# Patient Record
Sex: Female | Born: 1975
Health system: Southern US, Community
[De-identification: ages and names within clinical notes are randomized; demographics above are authoritative.]

## PROBLEM LIST (undated history)

## (undated) DIAGNOSIS — F329 Major depressive disorder, single episode, unspecified: Secondary | ICD-10-CM

## (undated) DIAGNOSIS — F419 Anxiety disorder, unspecified: Secondary | ICD-10-CM

## (undated) DIAGNOSIS — F32A Depression, unspecified: Secondary | ICD-10-CM

## (undated) HISTORY — PX: DILATION AND CURETTAGE OF UTERUS: SHX78

---

## 2001-07-29 ENCOUNTER — Other Ambulatory Visit: Admission: RE | Admit: 2001-07-29 | Discharge: 2001-07-29 | Payer: Self-pay | Admitting: Obstetrics and Gynecology

## 2001-09-18 ENCOUNTER — Inpatient Hospital Stay (HOSPITAL_COMMUNITY): Admission: AD | Admit: 2001-09-18 | Discharge: 2001-09-18 | Payer: Self-pay | Admitting: *Deleted

## 2002-02-20 ENCOUNTER — Inpatient Hospital Stay (HOSPITAL_COMMUNITY): Admission: AD | Admit: 2002-02-20 | Discharge: 2002-02-22 | Payer: Self-pay | Admitting: Obstetrics and Gynecology

## 2002-04-01 ENCOUNTER — Other Ambulatory Visit: Admission: RE | Admit: 2002-04-01 | Discharge: 2002-04-01 | Payer: Self-pay | Admitting: Obstetrics and Gynecology

## 2003-08-07 ENCOUNTER — Other Ambulatory Visit: Admission: RE | Admit: 2003-08-07 | Discharge: 2003-08-07 | Payer: Self-pay | Admitting: Obstetrics and Gynecology

## 2004-09-01 ENCOUNTER — Other Ambulatory Visit: Admission: RE | Admit: 2004-09-01 | Discharge: 2004-09-01 | Payer: Self-pay | Admitting: Obstetrics and Gynecology

## 2008-02-27 ENCOUNTER — Encounter: Admission: RE | Admit: 2008-02-27 | Discharge: 2008-02-27 | Payer: Self-pay | Admitting: Obstetrics and Gynecology

## 2010-12-11 ENCOUNTER — Encounter: Payer: Self-pay | Admitting: Obstetrics and Gynecology

## 2011-04-20 ENCOUNTER — Other Ambulatory Visit: Payer: Self-pay | Admitting: Obstetrics and Gynecology

## 2011-04-20 DIAGNOSIS — N644 Mastodynia: Secondary | ICD-10-CM

## 2011-04-25 ENCOUNTER — Ambulatory Visit
Admission: RE | Admit: 2011-04-25 | Discharge: 2011-04-25 | Disposition: A | Discharge status: Home / Self Care | Payer: PRIVATE HEALTH INSURANCE | Source: Ambulatory Visit | Attending: Obstetrics and Gynecology | Admitting: Obstetrics and Gynecology

## 2011-04-25 ENCOUNTER — Other Ambulatory Visit: Payer: Self-pay | Admitting: Obstetrics and Gynecology

## 2011-04-25 DIAGNOSIS — N644 Mastodynia: Secondary | ICD-10-CM

## 2017-08-09 DIAGNOSIS — F32A Depression, unspecified: Secondary | ICD-10-CM | POA: Insufficient documentation

## 2017-08-09 DIAGNOSIS — F329 Major depressive disorder, single episode, unspecified: Secondary | ICD-10-CM | POA: Insufficient documentation

## 2017-08-16 DIAGNOSIS — F909 Attention-deficit hyperactivity disorder, unspecified type: Secondary | ICD-10-CM | POA: Insufficient documentation

## 2017-08-16 DIAGNOSIS — G4700 Insomnia, unspecified: Secondary | ICD-10-CM | POA: Insufficient documentation

## 2017-08-16 DIAGNOSIS — Z8619 Personal history of other infectious and parasitic diseases: Secondary | ICD-10-CM | POA: Insufficient documentation

## 2017-08-16 DIAGNOSIS — F419 Anxiety disorder, unspecified: Secondary | ICD-10-CM | POA: Insufficient documentation

## 2017-11-20 HISTORY — PX: BREAST BIOPSY: SHX20

## 2018-05-28 MED FILL — TRANEXAMIC ACID 650 MG TAB: 650 | 5 days supply | Qty: 30 | Fill #0

## 2018-05-29 MED FILL — DOXEPIN 25 MG CAPSULE: 25 | 90 days supply | Qty: 90 | Fill #0

## 2018-05-29 MED FILL — VIT D2 1.25 MG (50,000 UNIT: 1.25 MG | 56 days supply | Qty: 8 | Fill #0

## 2018-05-29 MED FILL — DESVENLAFAXINE SUC ER 50 MG: 50 | 53 days supply | Qty: 53 | Fill #0

## 2018-05-31 DIAGNOSIS — F909 Attention-deficit hyperactivity disorder, unspecified type: Secondary | ICD-10-CM | POA: Diagnosis not present

## 2018-05-31 DIAGNOSIS — F419 Anxiety disorder, unspecified: Secondary | ICD-10-CM | POA: Diagnosis not present

## 2018-05-31 DIAGNOSIS — G4700 Insomnia, unspecified: Secondary | ICD-10-CM | POA: Diagnosis not present

## 2018-05-31 MED FILL — ALPRAZolam 1 MG TABS: 1 | 30 days supply | Qty: 60 | Fill #0

## 2018-05-31 MED FILL — METHYLPHENIDATE 10 MG TAB: 10 | 30 days supply | Qty: 60 | Fill #0

## 2018-07-01 DIAGNOSIS — F411 Generalized anxiety disorder: Secondary | ICD-10-CM | POA: Diagnosis not present

## 2018-07-01 DIAGNOSIS — R102 Pelvic and perineal pain: Secondary | ICD-10-CM | POA: Diagnosis not present

## 2018-07-01 DIAGNOSIS — Z809 Family history of malignant neoplasm, unspecified: Secondary | ICD-10-CM | POA: Diagnosis not present

## 2018-07-03 ENCOUNTER — Other Ambulatory Visit (HOSPITAL_COMMUNITY): Payer: Self-pay | Admitting: Radiology

## 2018-07-03 DIAGNOSIS — Z8481 Family history of carrier of genetic disease: Secondary | ICD-10-CM

## 2018-07-04 ENCOUNTER — Encounter: Payer: Self-pay | Admitting: *Deleted

## 2018-07-04 DIAGNOSIS — F411 Generalized anxiety disorder: Secondary | ICD-10-CM | POA: Diagnosis not present

## 2018-07-05 ENCOUNTER — Other Ambulatory Visit: Payer: Self-pay

## 2018-07-05 ENCOUNTER — Encounter: Payer: Self-pay | Admitting: Physician Assistant

## 2018-07-05 ENCOUNTER — Ambulatory Visit: Payer: 59 | Admitting: Physician Assistant

## 2018-07-05 VITALS — BP 110/78 | HR 84 | Temp 98.1°F | Resp 18 | Ht 66.14 in | Wt 179.4 lb

## 2018-07-05 DIAGNOSIS — F411 Generalized anxiety disorder: Secondary | ICD-10-CM

## 2018-07-05 DIAGNOSIS — G479 Sleep disorder, unspecified: Secondary | ICD-10-CM

## 2018-07-05 MED ORDER — CLONAZEPAM 0.5 MG PO TABS: 0.2500 mg | ORAL_TABLET | Freq: Three times a day (TID) | ORAL | 0 refills | Status: DC

## 2018-07-05 MED ORDER — TRAZODONE HCL 50 MG PO TABS: 25.0000 mg | ORAL_TABLET | Freq: Every evening | ORAL | 0 refills | Status: DC | PRN

## 2018-07-05 NOTE — Patient Instructions (Addendum)
Goal is to increase [employer] with klonopin -- you can take 1/2 -1 pill tid scheduled  Trazodone - start after you know how Klonopin works in your system  To start the Trazodone - start with 1/2 pill at night for the 1st 4 nights - increase the dose by 25mg  (1/2 pill) every 4-5 nights until either you sleep well, have side effects or reach a nightly dose of 150mg   [facility], [place], [place] [address] Phone: [phone]    If you have lab work done today you will be contacted with your lab results within the next 2 weeks.  If you have not heard from [place] then please contact us. The fastest way to get your results is to register for My Chart.   IF you received an x-ray today, you will receive an invoice from [facility] Rad[facility]. Please contact [facility] Radiology at [phone] with questions or concerns regarding your invoice.   IF you received labwork today, you will receive an invoice from [facility]. Please contact [facility] at [phone] with questions or concerns regarding your invoice.   Our billing staff will not be able to assist you with questions regarding bills from these companies.  You will be contacted with the lab results as soon as they are available. The fastest way to get your results is to activate your My Chart account. Instructions are located on the last page of this paperwork. If you have not heard from [place] regarding the results in 2 weeks, please contact this office.

## 2018-07-05 NOTE — Progress Notes (Signed)
Mallory Hernandez  MRN: [id number] DOB: 05-07-1976  PCP: Mancel Bale, PA-C  Chief Complaint  Patient presents with  . Establish Care    Subjective:  Pt presents to clinic for GAD and depression which she has had for a long time (started with post-partum (about 16 years ago), got worse in 2014 with a divorce) but for the last 2 months things have gotten worse.  The increase in her symptoms seem to mimic her time in her new job at [employer].  Nurse in cardio ICU.  Currently on Pristiq 50mg  (she has been on that since April - she felt like it was helping -- she has been on Lexapro (helped but then stopped), Wellbutrin (did not like), Zoloft (for post-partum depression).  Currently taking - Xanax - takes it a lot and that upsets her -but she gets very short relief and that her anxiety spikes  Ritalin - she has been on it for a year and it has helped with focus at work but recently with increased anxiety she is concerned that this is making it worse  Has 2 children- 24 and 32 - they do not live with her  Insomnia - been a problem for a while - gotten worse over the last several weeks - she used benadryl, doxipen (did not work), remeron (no help on 30mg ) - does not want to get out of bed - no motivation and energy - Feels defeated -- no SI or HI.  Cries all the time.  Seeing Dr Pablo Ledger with center for [facility].    Spoke with Dr. Pablo Ledger patient has significant depression and anxiety.  He had she has been on multiple medications in the past.  Currently on Pristiq though having significant breakthrough anxiety along with anxiety attacks and panic attacks.  Consider trial of rescue medications scheduled doses to help control anxiety so therapy can be useful as well as allow [place] in the next several weeks to increase Pristiq dose to a more appropriate dose for her level of anxiety.  Also needs help with sleep consider trazodone.  Depression and anxiety.   History is obtained by  patient.  Review of Systems  Psychiatric/Behavioral: Positive for dysphoric mood and sleep disturbance. Negative for self-injury and suicidal ideas. The patient is nervous/anxious.     There are no active problems to display for this patient.   Current Outpatient Medications on File Prior to Visit  Medication Sig Dispense Refill  . ALPRAZolam (XANAX) 1 MG tablet Take by mouth.    . desvenlafaxine (PRISTIQ) 50 MG 24 hr tablet Take by mouth.    . FeAsp-FeFum -Suc-C-Thre-B12-FA (MULTIGEN PLUS) 50-101-1 MG TABS Take by mouth.    . methylphenidate (RITALIN) 10 MG tablet Take by mouth.    . tranexamic acid (LYSTEDA) 650 MG TABS tablet   4  . Vitamin D, Ergocalciferol, (DRISDOL) 50000 units CAPS capsule Take by mouth.     No current facility-administered medications on file prior to visit.     Allergies  Allergen Reactions  . Amoxicillin-Pot Clavulanate Diarrhea    No past medical history on file. Social History   Social History Narrative  . Not on file   Social History   Tobacco Use  . Smoking status: Never Smoker  . Smokeless tobacco: Never Used  Substance Use Topics  . Alcohol use: Not on file  . Drug use: Not on file   family history is not on file.     Objective:  BP 110/78  Pulse 84   Temp 98.1 F (36.7 C) (Oral)   Resp 18   Ht 5' 6.14" (1.68 m)   Wt 179 lb 6.4 oz (81.4 kg)   LMP 07/01/2018   SpO2 98%   BMI 28.83 kg/m  Body mass index is 28.83 kg/m.  Wt Readings from Last 3 Encounters:  07/05/18 179 lb 6.4 oz (81.4 kg)    Physical Exam  Constitutional: She is oriented to person, place, and time. She appears well-developed and well-nourished.  HENT:  Head: Normocephalic and atraumatic.  Right Ear: Hearing and external ear normal.  Left Ear: Hearing and external ear normal.  Eyes: Conjunctivae are normal.  Neck: Normal range of motion.  Pulmonary/Chest: Effort normal.  Neurological: She is alert and oriented to person, place, and time.  Skin:  Skin is warm, dry and intact.  Psychiatric: She has a normal mood and affect. Her behavior is normal. Judgment and thought content normal.  Vitals reviewed.   Assessment and Plan :  GAD (generalized anxiety disorder) - Plan: clonazePAM (KLONOPIN) 0.5 MG tablet, traZODone (DESYREL) 50 MG tablet  Sleep disturbance - Plan: traZODone (DESYREL) 50 MG tablet   Patient has uncontrolled anxiety.  She is currently working with therapist but anxiety is so bad she is unable to do effective therapy.  We will use Klonopin and scheduled doses starting a quarter of a milligram working up to half a milligram if needed 3 times daily.  Once she has her anxiety controlled we will work on sleep with trazodone starting at 25 mg working up to 150 mg or more if needed to initiate and sustain sleep throughout the night.  Once we have her daily symptoms of anxiety controlled the plan is to increase Pristiq to area of control of anxiety where we can back down and use Klonopin as needed.  Patient will follow-up with me in the next 2 to 3 weeks so we can make sure she has medications to allow her to see me at the new office.  She will start Ritalin at this point as it may be making her anxiety worse discussed that this may be restarted in the future.  Patient verbalized to me that they understand the following: diagnosis, what is being done for them, what to expect and what should be done at home.  Their questions have been answered.  See after visit summary for patient specific instructions.  Windell Hummingbird PA-C  [facility] 07/05/2018 6:12 PM  Please note: Portions of this report may have been transcribed using dragon voice recognition software. Every effort was made to ensure accuracy; however, inadvertent computerized transcription errors may be present.

## 2018-07-08 ENCOUNTER — Encounter: Payer: Self-pay | Admitting: Physician Assistant

## 2018-07-08 ENCOUNTER — Ambulatory Visit: Payer: Self-pay | Admitting: Physician Assistant

## 2018-07-08 MED ORDER — LORAZEPAM 0.5 MG PO TABS: 0.2500 mg | ORAL_TABLET | Freq: Three times a day (TID) | ORAL | 0 refills | Status: DC

## 2018-07-09 DIAGNOSIS — F411 Generalized anxiety disorder: Secondary | ICD-10-CM | POA: Diagnosis not present

## 2018-07-15 DIAGNOSIS — F411 Generalized anxiety disorder: Secondary | ICD-10-CM | POA: Diagnosis not present

## 2018-07-16 ENCOUNTER — Encounter: Payer: Self-pay | Admitting: Physician Assistant

## 2018-07-16 ENCOUNTER — Telehealth: Payer: Self-pay | Admitting: Physician Assistant

## 2018-07-16 ENCOUNTER — Other Ambulatory Visit (HOSPITAL_COMMUNITY): Payer: Self-pay | Admitting: Radiology

## 2018-07-16 DIAGNOSIS — F411 Generalized anxiety disorder: Secondary | ICD-10-CM

## 2018-07-16 DIAGNOSIS — Z9189 Other specified personal risk factors, not elsewhere classified: Secondary | ICD-10-CM

## 2018-07-16 MED ORDER — ALPRAZOLAM ER 1 MG PO TB24: 1.0000 mg | ORAL_TABLET | Freq: Three times a day (TID) | ORAL | 0 refills | Status: DC

## 2018-07-16 MED FILL — ALPRAZolam XR 1 MG TB24: 1 | 10 days supply | Qty: 30 | Fill #0

## 2018-07-16 NOTE — Telephone Encounter (Signed)
Pt has brought the Ativan Klonopin and Xanax to the therapist and they will dispose of them.

## 2018-07-16 NOTE — Telephone Encounter (Signed)
  Talked with Dr Pablo Ledger.  Pt is not doing well with her medications.  She is changing her medication dosing based on her symptoms and not by guidance from either of [place].  She has Klonopin, Xanax, and Ativan at home.  She feels like klonopin gives her a headache.   She does not feel like the trazodone helps her sleep -- at night she is taking klonopin and xanax at night.  Because xanax is all that she feels that works and she does not want that changed.  She thinks the xanax works the best but she complains that she has to take to often in her visit with me. Switch to Xanax XR -- 1mg  tid -- for all day control.  At night she will have one Xanax XR and she can use the trazadone if she wants to.  She will return the other benzos she has at home before I write the Xanax XR.

## 2018-07-16 NOTE — Telephone Encounter (Signed)
    Duplicate chart - opened in error

## 2018-07-18 ENCOUNTER — Encounter: Payer: Self-pay | Admitting: Physician Assistant

## 2018-07-18 ENCOUNTER — Ambulatory Visit: Payer: 59 | Admitting: Physician Assistant

## 2018-07-18 ENCOUNTER — Other Ambulatory Visit: Payer: Self-pay

## 2018-07-18 VITALS — BP 108/72 | HR 89 | Temp 98.5°F | Resp 18 | Ht 66.14 in | Wt 179.6 lb

## 2018-07-18 DIAGNOSIS — F321 Major depressive disorder, single episode, moderate: Secondary | ICD-10-CM

## 2018-07-18 DIAGNOSIS — G479 Sleep disorder, unspecified: Secondary | ICD-10-CM | POA: Insufficient documentation

## 2018-07-18 DIAGNOSIS — F411 Generalized anxiety disorder: Secondary | ICD-10-CM | POA: Diagnosis not present

## 2018-07-18 NOTE — Progress Notes (Signed)
Mallory Hernandez  MRN: [id number] DOB: [birth year]  PCP: Mancel Bale, PA-C  Chief Complaint  Patient presents with  . Anxiety    follow up     Subjective:  Pt presents to clinic for recheck of her anxiety.  She started Xanax XR 1 mg 3 times daily dosing 2 days ago.  She feels much better in regards to her anxiety since doing this.  She feels like the roller coaster of immediate release Xanax has gone away, and she feels stable throughout the day in regards to her anxiety.  Her depression is much worse but she is aware that this may just be that her anxiety is not masking it.  She continues daily discussion with therapy office.  She has weekly therapy sessions.  She is still not sleeping well.  Feels like her mind will not turn off.  Depression - low motivation, no suicidal or homicidal ideation  Sleep disturbance - been a problem for years - gabapentin 3600mg  at night in the past -- benadryl does not help long, melatonin XR only gave about 2 hours, Ambien did not work.  GAD - pristiq 50mg  --   Spoke with Dr. Pablo Ledger at [facility] for [facility] therapy regarding patient her symptoms and current therapy as well as plans for medication therapy in the future.  Speak to him weekly regarding patient and her medications.  Working with him with her dose titrations.  History is obtained by patient.  Review of Systems  Psychiatric/Behavioral: Positive for dysphoric mood (Not controlled). The patient is nervous/anxious (More controlled).     Patient Active Problem List   Diagnosis Date Noted  . Current moderate episode of major depressive disorder without prior episode ([place]) 07/18/2018  . Sleep disturbance 07/18/2018  . GAD (generalized anxiety disorder) 07/18/2018  . Adult ADHD 08/16/2017  . Anxiety 08/16/2017  . H/O cold sores 08/16/2017  . Insomnia 08/16/2017  . Depression 08/09/2017    Current Outpatient Medications on File Prior to Visit  Medication Sig Dispense Refill  .  ALPRAZolam (XANAX XR) 1 MG 24 hr tablet Take 1 tablet (1 mg total) by mouth 3 (three) times daily. 30 tablet 0  . desvenlafaxine (PRISTIQ) 50 MG 24 hr tablet Take by mouth.    . FeAsp-FeFum -Suc-C-Thre-B12-FA (MULTIGEN PLUS) 50-101-1 MG TABS Take by mouth.    . tranexamic acid (LYSTEDA) 650 MG TABS tablet   4  . Vitamin D, Ergocalciferol, (DRISDOL) 50000 units CAPS capsule Take by mouth.    . clonazePAM (KLONOPIN) 0.5 MG tablet Take 0.5-1 tablets (0.25-0.5 mg total) by mouth 3 (three) times daily. (Patient not taking: Reported on 07/18/2018) 60 tablet 0  . LORazepam (ATIVAN) 0.5 MG tablet Take 0.5-1 tablets (0.25-0.5 mg total) by mouth 3 (three) times daily. (Patient not taking: Reported on 07/18/2018) 30 tablet 0  . methylphenidate (RITALIN) 10 MG tablet Take by mouth.     No current facility-administered medications on file prior to visit.     Allergies  Allergen Reactions  . Amoxicillin-Pot Clavulanate Diarrhea    No past medical history on file. Social History   Social History Narrative  . Not on file   Social History   Tobacco Use  . Smoking status: Never Smoker  . Smokeless tobacco: Never Used  Substance Use Topics  . Alcohol use: Not on file  . Drug use: Not on file   family history is not on file.     Objective:  BP 108/72   Pulse 89  Temp 98.5 F (36.9 C) (Oral)   Resp 18   Ht 5' 6.14" (1.68 m)   Wt 179 lb 9.6 oz (81.5 kg)   LMP 07/01/2018   SpO2 98%   BMI 28.87 kg/m  Body mass index is 28.87 kg/m.  Wt Readings from Last 3 Encounters:  07/18/18 179 lb 9.6 oz (81.5 kg)  07/05/18 179 lb 6.4 oz (81.4 kg)    Physical Exam  Constitutional: She is oriented to person, place, and time. She appears well-developed and well-nourished.  HENT:  Head: Normocephalic and atraumatic.  Right Ear: Hearing and external ear normal.  Left Ear: Hearing and external ear normal.  Eyes: Conjunctivae are normal.  Neck: Normal range of motion.  Pulmonary/Chest: Effort  normal.  Neurological: She is alert and oriented to person, place, and time.  Skin: Skin is warm, dry and intact.  Psychiatric: Her behavior is normal. Judgment and thought content normal.  Less anxious in office, no rolling in fidgeting with hands.  Not tearful in office today.  Vitals reviewed.  Spent 30 mins with the patient - greater than 50% of the visit was face to face counseling patient regarding current situation as well as speaking to therapist regarding her current situation and therapy options.   Assessment and Plan :  GAD (generalized anxiety disorder)  Sleep disturbance  Current moderate episode of major depressive disorder without prior episode ([facility])  Continue Xanax XR 1 mg 3 times daily.  Want to do this for the next several days to make sure she continues to be stable, then will likely increase Pristiq to 100 mg.  For now continue this dose of Xanax and not increase it at night.  Would like to see if she gets some benefit with sleep from continued use.  Patient tried trazodone but it caused her to have panic attacks, she is therefore concerned to restart this.  We talked about Remeron but concerned about weight gain.  She is been on gabapentin in the past but very high doses without much success.  Consider Seroquel at night.  Could increase Xanax XR at night but concerned that sleep is a long-term issue for her so this might become a problem.  She is very aware of the addictive potential of Xanax and would like to not be on this for longer than she has to.  She will continue daily check in with therapy office, and weekly visits with therapist.  I will communicate plan with therapist so he is aware of what we did today.  Patient verbalized to me that they understand the following: diagnosis, what is being done for them, what to expect and what should be done at home.  Their questions have been answered.  See after visit summary for patient specific instructions.  Windell Hummingbird  PA-C  [facility] 07/18/2018 5:04 PM  Please note: Portions of this report may have been transcribed using dragon voice recognition software. Every effort was made to ensure accuracy; however, inadvertent computerized transcription errors may be present.

## 2018-07-18 NOTE — Patient Instructions (Addendum)
   [facility], [place], [place] [address] Phone: [phone]  Continue Xanax 3x./day  Valerian root -- Melatonin 10mg  max  Medications we talked about for sleep Trazodone - may not feel comfortable Gabapentin - did not help Remeron - concern for weight gain Elavil -  Seroquel -   My chart me on Tuesday or Wedneday about how you are doing so I can get your more Xanax  If you have lab work done today you will be contacted with your lab results within the next 2 weeks.  If you have not heard from [place] then please contact us. The fastest way to get your results is to register for My Chart.   IF you received an x-ray today, you will receive an invoice from [facility] Rad[facility]. Please contact [facility] Radiology at [phone] with questions or concerns regarding your invoice.   IF you received labwork today, you will receive an invoice from [facility]. Please contact [facility] at [phone] with questions or concerns regarding your invoice.   Our billing staff will not be able to assist you with questions regarding bills from these companies.  You will be contacted with the lab results as soon as they are available. The fastest way to get your results is to activate your My Chart account. Instructions are located on the last page of this paperwork. If you have not heard from [place] regarding the results in 2 weeks, please contact this office.

## 2018-07-23 ENCOUNTER — Encounter: Payer: Self-pay | Admitting: Physician Assistant

## 2018-07-23 DIAGNOSIS — N924 Excessive bleeding in the premenopausal period: Secondary | ICD-10-CM | POA: Diagnosis not present

## 2018-07-23 DIAGNOSIS — R1031 Right lower quadrant pain: Secondary | ICD-10-CM | POA: Diagnosis not present

## 2018-07-23 DIAGNOSIS — Z8041 Family history of malignant neoplasm of ovary: Secondary | ICD-10-CM | POA: Diagnosis not present

## 2018-07-23 DIAGNOSIS — F411 Generalized anxiety disorder: Secondary | ICD-10-CM

## 2018-07-24 MED ORDER — ALPRAZOLAM ER 1 MG PO TB24: 1.0000 mg | ORAL_TABLET | Freq: Three times a day (TID) | ORAL | 0 refills | Status: DC

## 2018-07-24 MED FILL — ALPRAZolam XR 1 MG TB24: 1 | 30 days supply | Qty: 90 | Fill #0

## 2018-07-25 DIAGNOSIS — F411 Generalized anxiety disorder: Secondary | ICD-10-CM | POA: Diagnosis not present

## 2018-08-06 ENCOUNTER — Other Ambulatory Visit: Payer: Self-pay | Admitting: Physician Assistant

## 2018-08-06 DIAGNOSIS — F411 Generalized anxiety disorder: Secondary | ICD-10-CM

## 2018-08-06 MED ORDER — ALPRAZOLAM ER 1 MG PO TB24: ORAL_TABLET | ORAL | 0 refills | Status: AC

## 2018-08-06 NOTE — Progress Notes (Signed)
Opened in error this must be a duplicate chart.

## 2018-08-06 NOTE — Progress Notes (Signed)
Patient is doing okay on current dose of Xanax but needs some of her improved anxiety control in the morning.  Will increase dose to Xanax 2 mg in the morning, 1 mg in the afternoon and 2 mg in the evening.

## 2018-08-10 DIAGNOSIS — F411 Generalized anxiety disorder: Secondary | ICD-10-CM | POA: Diagnosis not present

## 2018-08-12 DIAGNOSIS — F411 Generalized anxiety disorder: Secondary | ICD-10-CM | POA: Diagnosis not present

## 2018-08-12 MED FILL — ALPRAZolam XR 1 MG TB24: 1 | 30 days supply | Qty: 150 | Fill #0

## 2018-08-14 ENCOUNTER — Other Ambulatory Visit: Payer: Self-pay | Admitting: Physician Assistant

## 2018-08-14 MED ORDER — DESVENLAFAXINE SUCCINATE ER 50 MG PO TB24: 50.0000 mg | ORAL_TABLET | Freq: Every day | ORAL | 0 refills | Status: DC

## 2018-08-14 MED ORDER — DESVENLAFAXINE SUCCINATE ER 100 MG PO TB24: 100.0000 mg | ORAL_TABLET | Freq: Every day | ORAL | 0 refills | Status: AC

## 2018-08-14 MED FILL — DESVENLAFAXINE SUC ER 50 MG: 50 | 90 days supply | Qty: 90 | Fill #0

## 2018-08-14 MED FILL — DESVENLAFAXINE SUC ER 100 M: 100 | 90 days supply | Qty: 90 | Fill #0

## 2018-08-14 NOTE — Progress Notes (Signed)
Ok to increase pristiq to 150mg 

## 2018-08-20 DIAGNOSIS — K648 Other hemorrhoids: Secondary | ICD-10-CM | POA: Diagnosis not present

## 2018-08-20 DIAGNOSIS — Z1211 Encounter for screening for malignant neoplasm of colon: Secondary | ICD-10-CM | POA: Diagnosis not present

## 2018-08-20 DIAGNOSIS — Z8371 Family history of colonic polyps: Secondary | ICD-10-CM | POA: Diagnosis not present

## 2018-08-22 DIAGNOSIS — F411 Generalized anxiety disorder: Secondary | ICD-10-CM | POA: Diagnosis not present

## 2018-08-23 DIAGNOSIS — Z1231 Encounter for screening mammogram for malignant neoplasm of breast: Secondary | ICD-10-CM | POA: Diagnosis not present

## 2018-08-23 DIAGNOSIS — N6320 Unspecified lump in the left breast, unspecified quadrant: Secondary | ICD-10-CM | POA: Diagnosis not present

## 2018-08-28 ENCOUNTER — Other Ambulatory Visit: Payer: Self-pay | Admitting: Obstetrics and Gynecology

## 2018-08-28 DIAGNOSIS — R928 Other abnormal and inconclusive findings on diagnostic imaging of breast: Secondary | ICD-10-CM

## 2018-08-29 DIAGNOSIS — F411 Generalized anxiety disorder: Secondary | ICD-10-CM | POA: Diagnosis not present

## 2018-09-03 ENCOUNTER — Ambulatory Visit
Admission: RE | Admit: 2018-09-03 | Discharge: 2018-09-03 | Disposition: A | Discharge status: Home / Self Care | Payer: 59 | Source: Ambulatory Visit | Attending: Obstetrics and Gynecology | Admitting: Obstetrics and Gynecology

## 2018-09-03 ENCOUNTER — Other Ambulatory Visit: Payer: Self-pay | Admitting: Obstetrics and Gynecology

## 2018-09-03 DIAGNOSIS — R928 Other abnormal and inconclusive findings on diagnostic imaging of breast: Secondary | ICD-10-CM

## 2018-09-03 DIAGNOSIS — N6313 Unspecified lump in the right breast, lower outer quadrant: Secondary | ICD-10-CM | POA: Diagnosis not present

## 2018-09-03 DIAGNOSIS — N6310 Unspecified lump in the right breast, unspecified quadrant: Secondary | ICD-10-CM

## 2018-09-03 DIAGNOSIS — Z803 Family history of malignant neoplasm of breast: Secondary | ICD-10-CM | POA: Diagnosis not present

## 2018-09-03 DIAGNOSIS — R922 Inconclusive mammogram: Secondary | ICD-10-CM | POA: Diagnosis not present

## 2018-09-04 ENCOUNTER — Ambulatory Visit
Admission: RE | Admit: 2018-09-04 | Discharge: 2018-09-04 | Disposition: A | Discharge status: Home / Self Care | Payer: 59 | Source: Ambulatory Visit | Attending: Obstetrics and Gynecology | Admitting: Obstetrics and Gynecology

## 2018-09-04 DIAGNOSIS — F411 Generalized anxiety disorder: Secondary | ICD-10-CM | POA: Diagnosis not present

## 2018-09-04 DIAGNOSIS — D241 Benign neoplasm of right breast: Secondary | ICD-10-CM | POA: Diagnosis not present

## 2018-09-04 DIAGNOSIS — N6313 Unspecified lump in the right breast, lower outer quadrant: Secondary | ICD-10-CM | POA: Diagnosis not present

## 2018-09-04 DIAGNOSIS — N6310 Unspecified lump in the right breast, unspecified quadrant: Secondary | ICD-10-CM

## 2018-09-12 DIAGNOSIS — F411 Generalized anxiety disorder: Secondary | ICD-10-CM | POA: Diagnosis not present

## 2018-09-12 MED FILL — ALPRAZolam XR 1 MG TB24: 1 | 30 days supply | Qty: 150 | Fill #0

## 2018-09-17 DIAGNOSIS — Z79899 Other long term (current) drug therapy: Secondary | ICD-10-CM | POA: Diagnosis not present

## 2018-09-17 DIAGNOSIS — F411 Generalized anxiety disorder: Secondary | ICD-10-CM | POA: Diagnosis not present

## 2018-09-17 DIAGNOSIS — E559 Vitamin D deficiency, unspecified: Secondary | ICD-10-CM | POA: Diagnosis not present

## 2018-09-17 DIAGNOSIS — Z0000 Encounter for general adult medical examination without abnormal findings: Secondary | ICD-10-CM | POA: Diagnosis not present

## 2018-09-17 DIAGNOSIS — D509 Iron deficiency anemia, unspecified: Secondary | ICD-10-CM | POA: Diagnosis not present

## 2018-10-15 MED FILL — traZODone HCL 100 MG TABS: 100 | 30 days supply | Qty: 90 | Fill #0

## 2018-10-15 MED FILL — ALPRAZolam XR 1 MG TB24: 1 | 30 days supply | Qty: 150 | Fill #0

## 2018-11-07 MED FILL — DESVENLAFAXINE SUC ER 50 MG: 50 | 90 days supply | Qty: 90 | Fill #0

## 2018-11-07 MED FILL — DESVENLAFAXINE SUC ER 100 M: 100 | 90 days supply | Qty: 90 | Fill #0

## 2018-11-22 ENCOUNTER — Inpatient Hospital Stay (HOSPITAL_COMMUNITY)
Admission: AD | Admit: 2018-11-22 | Discharge: 2018-11-22 | Disposition: A | Discharge status: Home / Self Care | Payer: 59 | Attending: Obstetrics and Gynecology | Admitting: Obstetrics and Gynecology

## 2018-11-22 ENCOUNTER — Encounter (HOSPITAL_COMMUNITY): Payer: Self-pay | Admitting: *Deleted

## 2018-11-22 ENCOUNTER — Inpatient Hospital Stay (HOSPITAL_COMMUNITY): Payer: 59

## 2018-11-22 ENCOUNTER — Other Ambulatory Visit: Payer: Self-pay

## 2018-11-22 DIAGNOSIS — Z79899 Other long term (current) drug therapy: Secondary | ICD-10-CM | POA: Insufficient documentation

## 2018-11-22 DIAGNOSIS — F419 Anxiety disorder, unspecified: Secondary | ICD-10-CM | POA: Insufficient documentation

## 2018-11-22 DIAGNOSIS — Z885 Allergy status to narcotic agent status: Secondary | ICD-10-CM | POA: Diagnosis not present

## 2018-11-22 DIAGNOSIS — D259 Leiomyoma of uterus, unspecified: Secondary | ICD-10-CM | POA: Diagnosis not present

## 2018-11-22 DIAGNOSIS — F329 Major depressive disorder, single episode, unspecified: Secondary | ICD-10-CM | POA: Diagnosis not present

## 2018-11-22 DIAGNOSIS — Z880 Allergy status to penicillin: Secondary | ICD-10-CM | POA: Insufficient documentation

## 2018-11-22 DIAGNOSIS — R102 Pelvic and perineal pain: Secondary | ICD-10-CM

## 2018-11-22 HISTORY — DX: Depression, unspecified: F32.A

## 2018-11-22 HISTORY — DX: Major depressive disorder, single episode, unspecified: F32.9

## 2018-11-22 HISTORY — DX: Anxiety disorder, unspecified: F41.9

## 2018-11-22 LAB — CBC WITH DIFFERENTIAL/PLATELET
Basophils Absolute: 0 10*3/uL (ref 0.0–0.1)
Basophils Relative: 0 %
Eosinophils Absolute: 0.1 10*3/uL (ref 0.0–0.5)
Eosinophils Relative: 1 %
HEMATOCRIT: 41.7 % (ref 36.0–46.0)
HEMOGLOBIN: 13.6 g/dL (ref 12.0–15.0)
LYMPHS PCT: 29 %
Lymphs Abs: 1.9 10*3/uL (ref 0.7–4.0)
MCH: 30.4 pg (ref 26.0–34.0)
MCHC: 32.6 g/dL (ref 30.0–36.0)
MCV: 93.1 fL (ref 80.0–100.0)
MONOS PCT: 6 %
Monocytes Absolute: 0.4 10*3/uL (ref 0.1–1.0)
NEUTROS ABS: 4.1 10*3/uL (ref 1.7–7.7)
NEUTROS PCT: 64 %
Platelets: 233 10*3/uL (ref 150–400)
RBC: 4.48 MIL/uL (ref 3.87–5.11)
RDW: 12.7 % (ref 11.5–15.5)
WBC: 6.5 10*3/uL (ref 4.0–10.5)
nRBC: 0 % (ref 0.0–0.2)

## 2018-11-22 LAB — URINALYSIS, ROUTINE W REFLEX MICROSCOPIC
Bilirubin Urine: NEGATIVE
GLUCOSE, UA: NEGATIVE mg/dL
Ketones, ur: NEGATIVE mg/dL
Leukocytes, UA: NEGATIVE
Nitrite: NEGATIVE
Protein, ur: NEGATIVE mg/dL
Specific Gravity, Urine: 1.005 (ref 1.005–1.030)
pH: 5 (ref 5.0–8.0)

## 2018-11-22 LAB — POCT PREGNANCY, URINE: PREG TEST UR: NEGATIVE

## 2018-11-22 MED ORDER — KETOROLAC TROMETHAMINE 30 MG/ML IJ SOLN
30.0000 mg | Freq: Once | INTRAMUSCULAR | Status: AC
  Administered 2018-11-22: 30 mg via INTRAVENOUS
  Filled 2018-11-22: qty 1

## 2018-11-22 MED ORDER — KETOROLAC TROMETHAMINE 10 MG PO TABS: 10.0000 mg | ORAL_TABLET | Freq: Four times a day (QID) | ORAL | 0 refills | Status: AC | PRN

## 2018-11-22 MED ORDER — OXYCODONE-ACETAMINOPHEN 5-325 MG PO TABS: 1.0000 | ORAL_TABLET | ORAL | 0 refills | Status: AC | PRN

## 2018-11-22 NOTE — MAU Provider Note (Signed)
History     CSN: [id number]  Arrival date and time: 11/22/18 1547   First Provider Initiated Contact with Patient 11/22/18 1644      Chief Complaint  Patient presents with  . Pelvic Pain   HPI Mallory Hernandez is a 43 y.o. G2P2002 non pregnant female who presents with pelvic pain. She states this has been ongoing for 2-3 weeks but is gradually getting worse. She rates the pain an 8/10 and took tylenol and ibuprofen with minimal relief. She denies any bleeding or discharge. She went to urgent care and had a UA and pelvic exam with swabs.   OB History    Gravida  2   Para  2   Term  2   Preterm      AB      Living  2     SAB      TAB      Ectopic      Multiple      Live Births              Past Medical History:  Diagnosis Date  . Anxiety   . Depression     Past Surgical History:  Procedure Laterality Date  . BREAST BIOPSY Left   . DILATION AND CURETTAGE OF UTERUS      Family History  Problem Relation Age of Onset  . Breast cancer Mother 98       77  . Breast cancer Paternal Aunt     Social History   Tobacco Use  . Smoking status: Never Smoker  . Smokeless tobacco: Never Used  Substance Use Topics  . Alcohol use: Not on file    Comment: social  . Drug use: Never    Allergies:  Allergies  Allergen Reactions  . Amoxicillin-Pot Clavulanate Diarrhea  . Codeine Nausea And Vomiting    Medications Prior to Admission  Medication Sig Dispense Refill Last Dose  . ALPRAZolam (XANAX XR) 1 MG 24 hr tablet 2 po qam, 1 po q afternoon, 2 po qhs 150 tablet 0 11/22/2018 at Unknown time  . desvenlafaxine (PRISTIQ) 100 MG 24 hr tablet Take 1 tablet (100 mg total) by mouth daily. (Patient taking differently: Take 200 mg by mouth daily. ) 90 tablet 0 11/22/2018 at Unknown time  . ibuprofen (ADVIL,MOTRIN) 200 MG tablet Take 800 mg by mouth every 6 (six) hours as needed for headache, moderate pain or cramping.   11/09/2018  . traZODone (DESYREL) 150 MG  tablet Take 300 mg by mouth at bedtime.    11/21/2018 at Unknown time  . FeAsp-FeFum -Suc-C-Thre-B12-FA (MULTIGEN PLUS) 50-101-1 MG TABS Take by mouth.   11/20/2018  . tranexamic acid (LYSTEDA) 650 MG TABS tablet Take 1,300 mg by mouth 3 (three) times daily. Only during menstrual cycle.  4 11/09/2018    Review of Systems  Constitutional: Negative.  Negative for fatigue and fever.  HENT: Negative.   Respiratory: Negative.  Negative for shortness of breath.   Cardiovascular: Negative.  Negative for chest pain.  Gastrointestinal: Negative.  Negative for abdominal pain, constipation, diarrhea, nausea and vomiting.  Genitourinary: Positive for pelvic pain. Negative for dysuria, vaginal bleeding and vaginal discharge.  Neurological: Negative.  Negative for dizziness and headaches.   Physical Exam   Blood pressure 124/76, pulse 84, temperature 98.3 F (36.8 C), temperature source Oral, resp. rate 18, weight 80.4 kg, last menstrual period 11/09/2018, SpO2 100 %.  Physical Exam  Nursing note and vitals reviewed. Constitutional: She is  oriented to person, place, and time. She appears well-developed and well-nourished. No distress.  HENT:  Head: Normocephalic.  Eyes: Pupils are equal, round, and reactive to light.  Cardiovascular: Normal rate, regular rhythm and normal heart sounds.  Respiratory: Effort normal and breath sounds normal. No respiratory distress.  GI: Soft. Bowel sounds are normal. She exhibits no distension. There is abdominal tenderness in the right lower quadrant and suprapubic area. There is no rigidity and no guarding.  Neurological: She is alert and oriented to person, place, and time.  Skin: Skin is warm and dry.  Psychiatric: She has a normal mood and affect. Her behavior is normal. Judgment and thought content normal.    MAU Course  Procedures Results for orders placed or performed during the hospital encounter of 11/22/18 (from the past 24 hour(s))  Urinalysis, Routine w  reflex microscopic     Status: Abnormal   Collection Time: 11/22/18  4:24 PM  Result Value Ref Range   Color, Urine STRAW (A) YELLOW   APPearance CLEAR CLEAR   Specific Gravity, Urine 1.005 1.005 - 1.030   pH 5.0 5.0 - 8.0   Glucose, UA NEGATIVE NEGATIVE mg/dL   Hgb urine dipstick SMALL (A) NEGATIVE   Bilirubin Urine NEGATIVE NEGATIVE   Ketones, ur NEGATIVE NEGATIVE mg/dL   Protein, ur NEGATIVE NEGATIVE mg/dL   Nitrite NEGATIVE NEGATIVE   Leukocytes, UA NEGATIVE NEGATIVE   RBC / HPF 0-5 0 - 5 RBC/hpf   WBC, UA 0-5 0 - 5 WBC/hpf   Bacteria, UA RARE (A) NONE SEEN   Squamous Epithelial / LPF 0-5 0 - 5  Pregnancy, urine POC     Status: None   Collection Time: 11/22/18  4:31 PM  Result Value Ref Range   Preg Test, Ur NEGATIVE NEGATIVE  CBC with Differential/Platelet     Status: None   Collection Time: 11/22/18  4:59 PM  Result Value Ref Range   WBC 6.5 4.0 - 10.5 K/uL   RBC 4.48 3.87 - 5.11 MIL/uL   Hemoglobin 13.6 12.0 - 15.0 g/dL   HCT 41.7 36.0 - 46.0 %   MCV 93.1 80.0 - 100.0 fL   MCH 30.4 26.0 - 34.0 pg   MCHC 32.6 30.0 - 36.0 g/dL   RDW 12.7 11.5 - 15.5 %   Platelets 233 150 - 400 K/uL   nRBC 0.0 0.0 - 0.2 %   Neutrophils Relative % 64 %   Neutro Abs 4.1 1.7 - 7.7 K/uL   Lymphocytes Relative 29 %   Lymphs Abs 1.9 0.7 - 4.0 K/uL   Monocytes Relative 6 %   Monocytes Absolute 0.4 0.1 - 1.0 K/uL   Eosinophils Relative 1 %   Eosinophils Absolute 0.1 0.0 - 0.5 K/uL   Basophils Relative 0 %   Basophils Absolute 0.0 0.0 - 0.1 K/uL   US Pelvic Complete W Transvaginal And Torsion R/o  Result Date: 11/22/2018 CLINICAL DATA:  Intermittent pelvic pain since 11/09/2018 EXAM: TRANSABDOMINAL AND TRANSVAGINAL ULTRASOUND OF PELVIS DOPPLER ULTRASOUND OF OVARIES TECHNIQUE: Both transabdominal and transvaginal ultrasound examinations of the pelvis were performed. Transabdominal technique was performed for global imaging of the pelvis including uterus, ovaries, adnexal regions, and pelvic  cul-de-sac. It was necessary to proceed with endovaginal exam following the transabdominal exam to visualize the uterus, endometrium and ovaries to better advantage. Color and duplex Doppler ultrasound was utilized to evaluate blood flow to the ovaries. COMPARISON:  None. FINDINGS: Uterus Measurements: 10.2 x 6.3 x 6.4 cm = volume:  216 mL. Apparent nearly isoechoic fibroid along the right posterior upper uterine segment measuring 4.6 x 2.7 x 3.4 cm. No other uterine masses. Multiple cervical nabothian cysts. Endometrium Thickness: 9 mm.  No focal abnormality visualized. Right ovary Measurements: 3.8 x 1.9 x 2.8 cm = volume: 10.3 mL. Normal appearance/no adnexal mass. Left ovary Measurements: 2.3 x 1.4 x 1.6 cm = volume: 2.8 mL. Normal appearance/no adnexal mass. Pulsed Doppler evaluation of both ovaries demonstrates normal low-resistance arterial and venous waveforms. Other findings No abnormal free fluid. IMPRESSION: 1. No acute findings. 2. Probable uterine fibroid measuring 4.6 cm. No other uterine abnormality. 3. Ovaries and adnexa are within normal limits.  No ovarian torsion. Electronically Signed   By: Lajean Manes M.D.   On: 11/22/2018 17:49   MDM UA, UPT CBC US Pelvis Comp Toradol IM  Assessment and Plan   1. Uterine leiomyoma, unspecified location   2. Pelvic pain    -Discharge home in stable condition -Rx for toradol and percocet given to patient -Abdominal precautions discussed -Patient advised to follow-up with gyn for management of pelvic pain. Instructed to go to [facility] or [facility] if condition worsens -Patient may return to MAU as needed or if her condition were to change or worsen   [place] 11/22/2018, 4:45 PM

## 2018-11-22 NOTE — MAU Note (Addendum)
Started with last cycle, severe rt ovarian pain. Never had pain like this before. Called office, told if it didn't stop to come in.  Pain has been off and on, thought it might be a UTI, went to [facility], was told urine was clear.  Pain and pressure has increased.

## 2018-11-22 NOTE — Discharge Instructions (Signed)
Uterine Fibroids    Uterine fibroids are lumps of tissue (tumors) in your womb (uterus). They are not cancer (are benign).  Most women with this condition do not need treatment. Sometimes fibroids can affect your ability to have children (your fertility). If that happens, you may need surgery to take out the fibroids.  Follow these instructions at home:  · Take over-the-counter and prescription medicines only as told by your doctor. Your doctor may suggest NSAIDs (such as aspirin or ibuprofen) to help with pain.  · Ask your doctor if you should:  ? Take iron pills.  ? Eat more foods that have iron in them, such as dark green, leafy vegetables.  · If directed, apply heat to your back or belly to reduce pain. Use the heat source that your doctor recommends, such as a moist heat pack or a heating pad.  ? Put a towel between your skin and the heat source.  ? Leave the heat on for 20-30 minutes.  ? Remove the heat if your skin turns bright red. This is especially important if you are unable to feel pain, heat, or cold. You may have a greater risk of getting burned.  · Pay close attention to your period (menstrual) cycles. Tell your doctor about any changes, such as:  ? A heavier blood flow than usual.  ? Needing to use more pads or tampons than normal.  ? A change in how many days your period lasts.  ? A change in symptoms that come with your period, such as cramps or back pain.  · Keep all follow-up visits as told by your doctor. This is important. Your doctor may need to watch your fibroids over time for any changes.  Contact a doctor if you:  · Have pain that does not get better with medicine or heat, such as pain or cramps in:  ? Your back.  ? The area between your hip bones (pelvic area).  ? Your belly.  · Have new bleeding between your periods.  · Have more bleeding during or between your periods.  · Feel very tired or weak.  · Feel light-headed.  Get help right away if you:  · Pass out (faint).  · Have pain in the  area between your hip bones that suddenly gets worse.  · Have bleeding that soaks a tampon or pad in 30 minutes or less.  Summary  · Uterine fibroids are lumps of tissue (tumors) in your womb (uterus). They are not cancer.  · The only treatment that most women need is taking aspirin or ibuprofen for pain.  · Contact a doctor if you have pain or cramps that do not get better with medicine.  · Make sure you know what symptoms you should get help for right away.  This information is not intended to replace advice given to you by your health care provider. Make sure you discuss any questions you have with your health care provider.  Document Released: 12/09/2010 Document Revised: 10/02/2017 Document Reviewed: 10/02/2017  [employer] Interactive Patient Education © 2019 [employer]

## 2018-11-22 NOTE — MAU Note (Signed)
Urine in lab 

## 2018-11-25 MED FILL — ALPRAZolam XR 1 MG TB24: 1 | 30 days supply | Qty: 150 | Fill #1

## 2018-11-27 DIAGNOSIS — N946 Dysmenorrhea, unspecified: Secondary | ICD-10-CM | POA: Diagnosis not present

## 2018-11-27 DIAGNOSIS — N924 Excessive bleeding in the premenopausal period: Secondary | ICD-10-CM | POA: Diagnosis not present

## 2018-11-27 DIAGNOSIS — R102 Pelvic and perineal pain: Secondary | ICD-10-CM | POA: Diagnosis not present

## 2018-11-27 MED FILL — DAYSEE 0.15-0.03-0.01 MG TA: 0.15-0.03 & | 91 days supply | Qty: 91 | Fill #0

## 2019-01-09 MED FILL — ALPRAZolam XR 1 MG TB24: 1 | 30 days supply | Qty: 150 | Fill #0

## 2019-09-02 IMAGING — US ULTRASOUND RIGHT BREAST LIMITED
1 series · 8 of 8 positions shown · non-contrast
Comparison: Previous exam(s).

CLINICAL DATA: Screening recall for possible right breast mass.
Strong family history of breast cancer with patient's mother
initially diagnosed with breast cancer at age 53.

EXAM:
DIGITAL DIAGNOSTIC UNILATERAL RIGHT MAMMOGRAM WITH CAD AND TOMO
RIGHT BREAST ULTRASOUND

[Series 1: ultrasound right breast limited · 0.06mm/px · 8 of 8 slices shown]
[im 1/8]
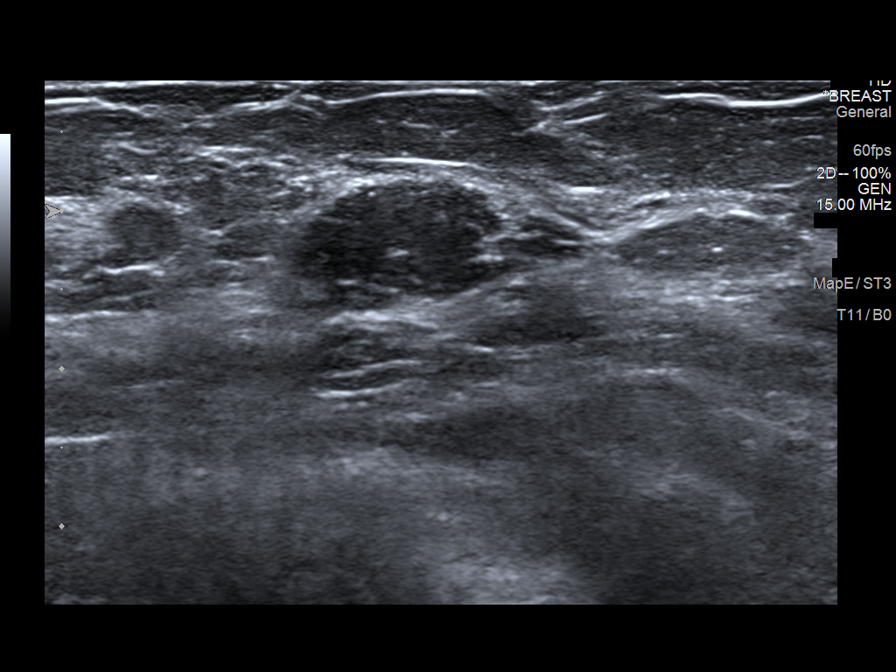
[im 2/8]
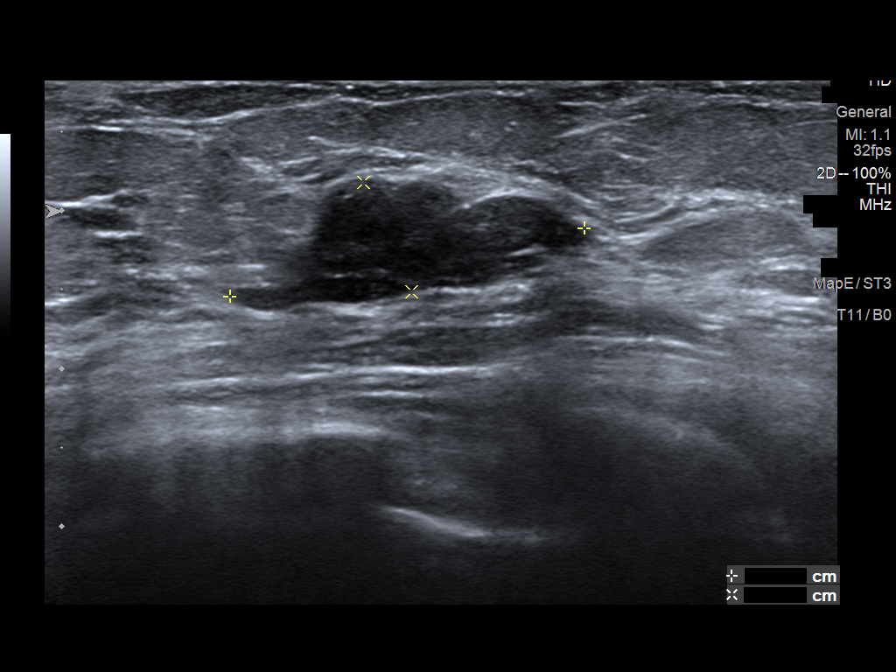
[im 3/8]
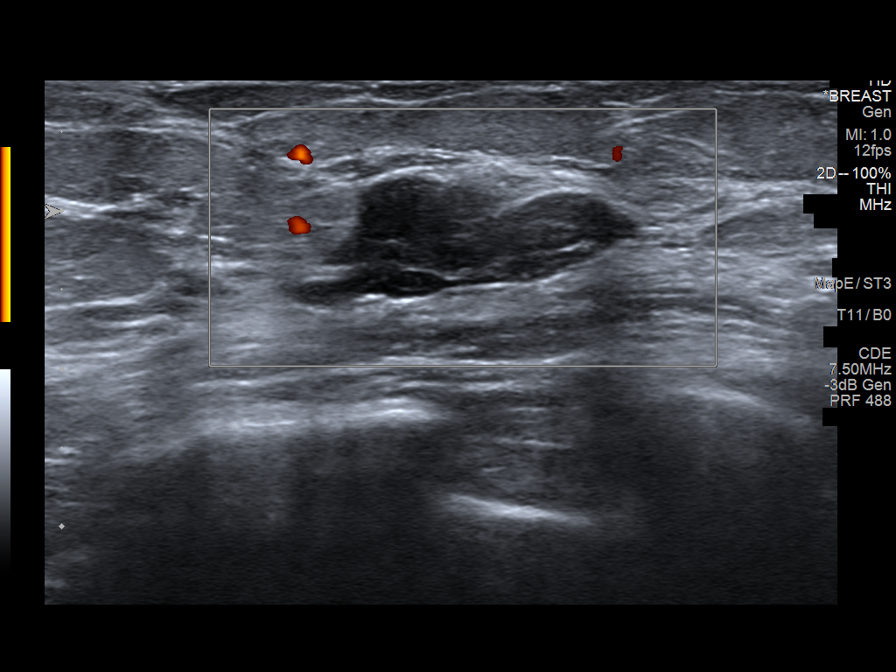
[im 4/8]
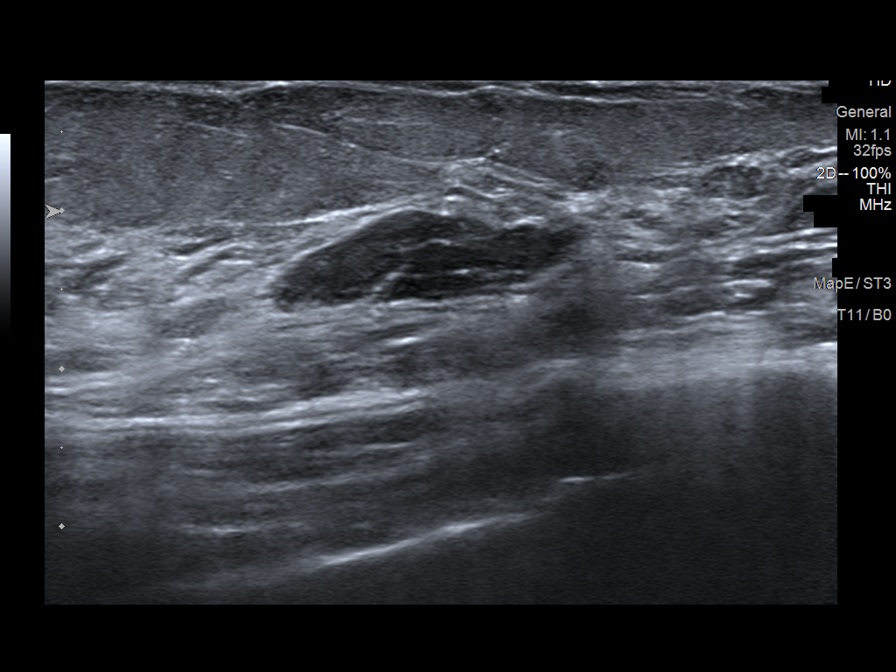
[im 5/8]
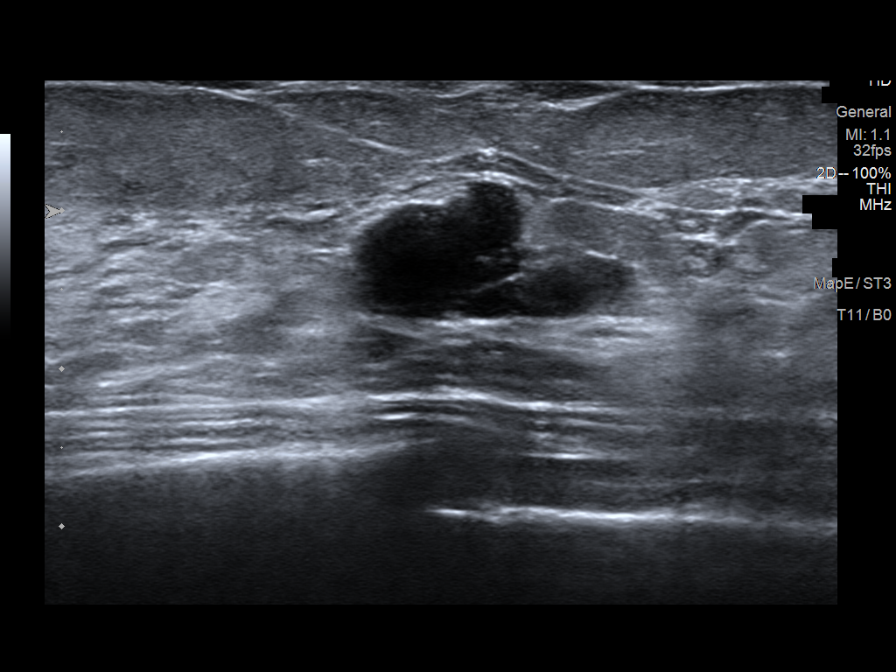
[im 6/8]
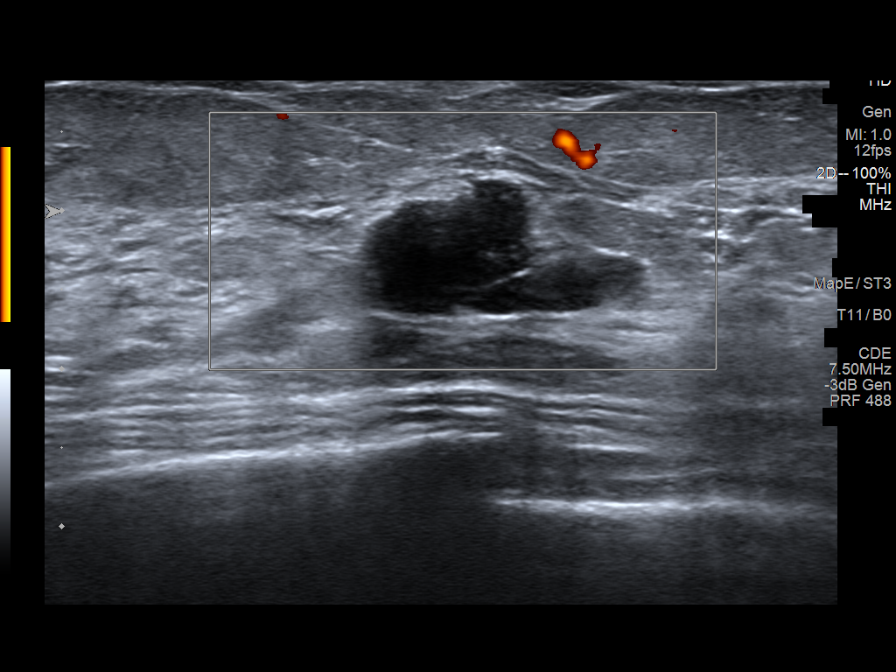
[im 7/8]
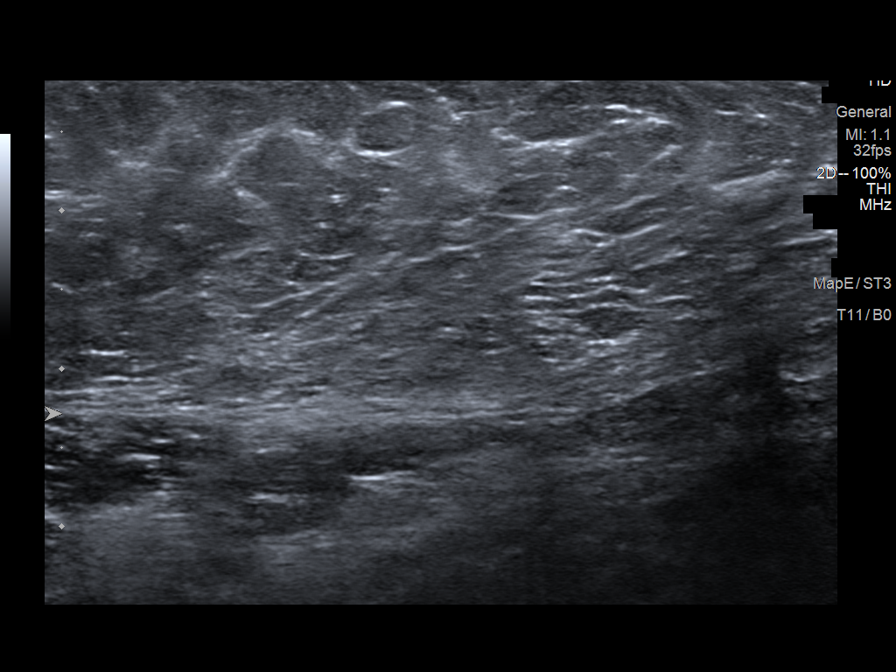
[im 8/8]
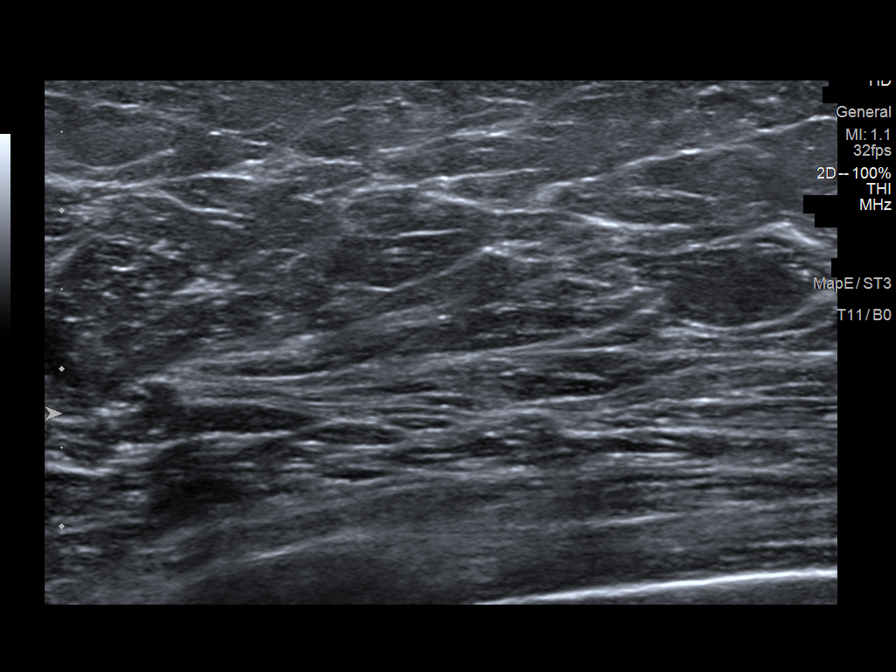

[8 of 8 positions shown; findings below may reference images not displayed]

ACR Breast Density Category c: The breast tissue is heterogeneously
dense, which may obscure small masses.
FINDINGS: Spot compression CC and MLO tomograms were performed of the right
breast. There is an oval mass with obscured margins measuring
approximately 2 cm.

Mammographic images were processed with CAD.

Targeted ultrasound of the lower outer right breast was performed
demonstrating an oval lobulated hypoechoic mass at 8 o'clock 5 cm
from the nipple measuring 2.3 x 0.8 x 1.7 cm. This may represent a
fibroadenoma, however this is indeterminate given lobulated somewhat
irregular margins. This corresponds well with the mass seen in the
lower outer right breast at mammography. No lymphadenopathy seen in
the right axilla.
IMPRESSION: Indeterminate right breast mass possibly a fibroadenoma however
biopsy is warranted given slight margin irregularity.

RECOMMENDATION:
1. Ultrasound-guided biopsy of the mass in the right breast at the 8
o'clock position is recommended. This will be scheduled for the
patient.

2. Strong family history of breast cancer with mother initially
diagnosed with breast cancer at age 53. Patient states she has
already discussed high risk screening breast MRI with her primary
physician. The [employer] recommends annual MRI and
mammography in patients with an estimated lifetime risk of
developing breast cancer greater than 20 - 25%, or who are known or
suspected to be positive for the breast cancer gene.

I have discussed the findings and recommendations with the patient.
Results were also provided in writing at the conclusion of the
visit. If applicable, a reminder letter will be sent to the patient
regarding the next appointment.

BI-RADS CATEGORY  4: Suspicious.

## 2019-09-03 IMAGING — MG MM CLIP PLACEMENT
2 series · 2 of 2 positions shown · non-contrast
Comparison: Previous exam(s).

CLINICAL DATA: Post ultrasound-guided core needle biopsy of right
breast 8 o'clock mass.

EXAM:
DIAGNOSTIC RIGHT MAMMOGRAM POST ULTRASOUND BIOPSY

[R ML]
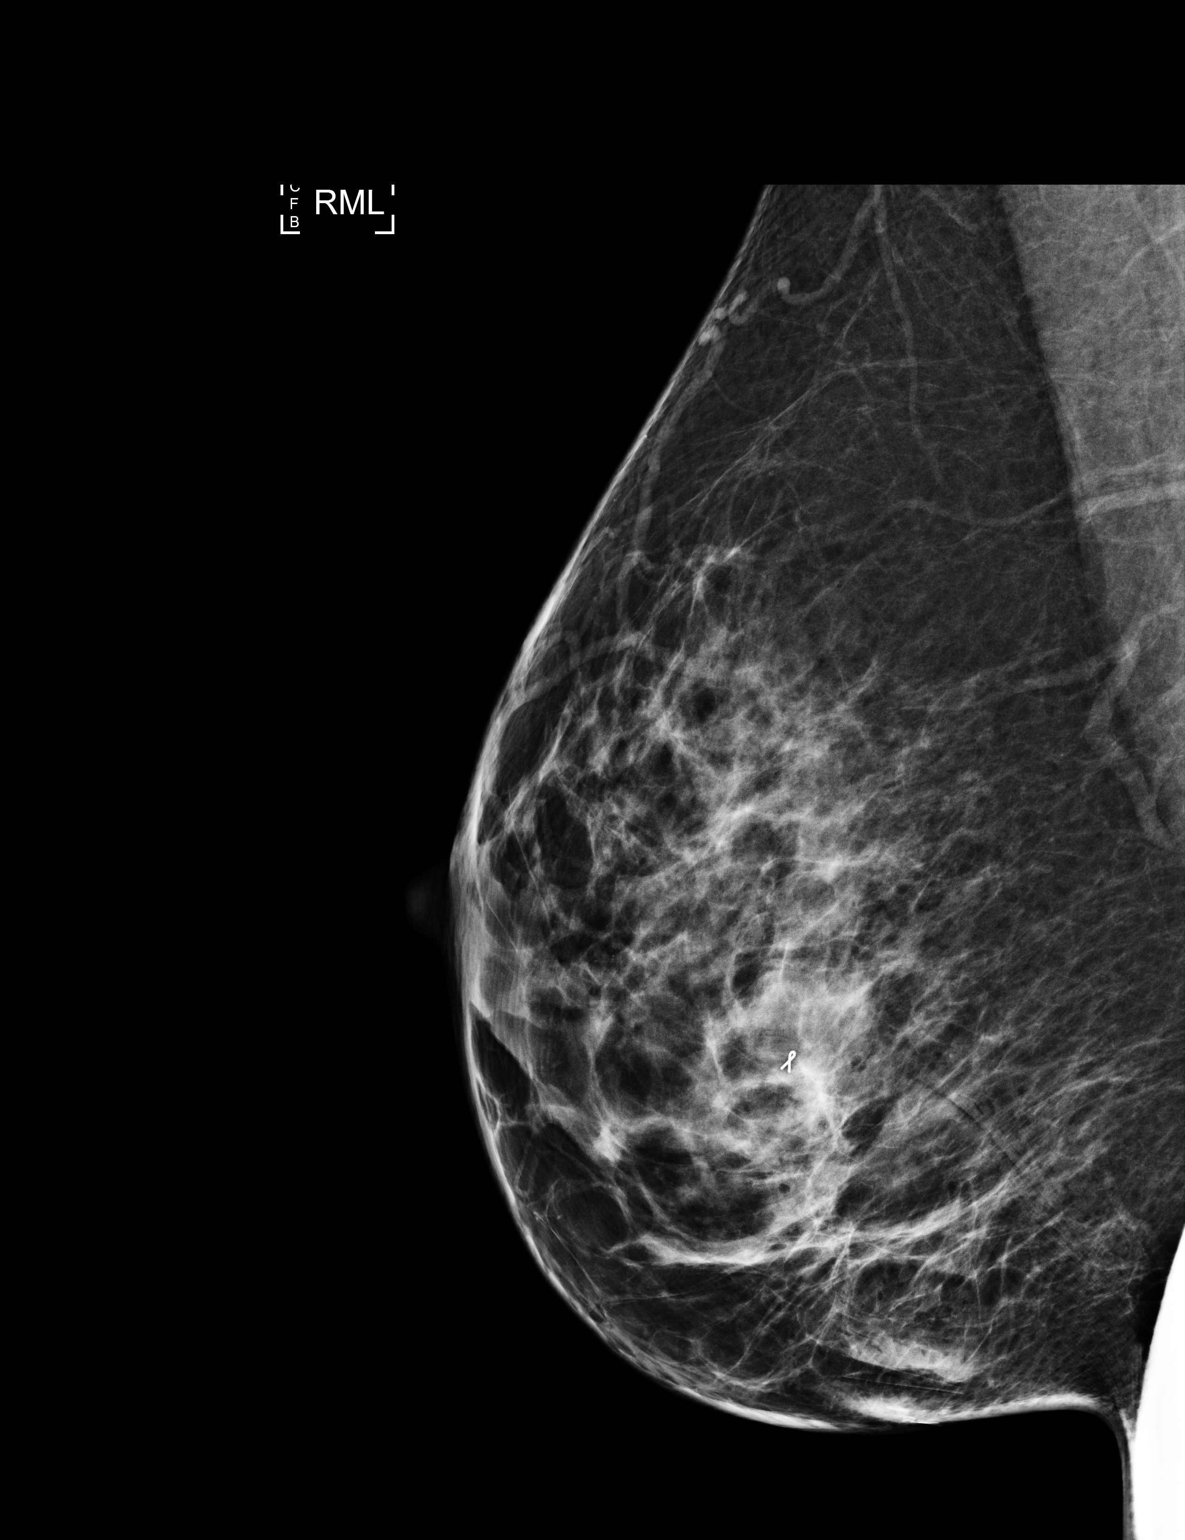

[R CC]
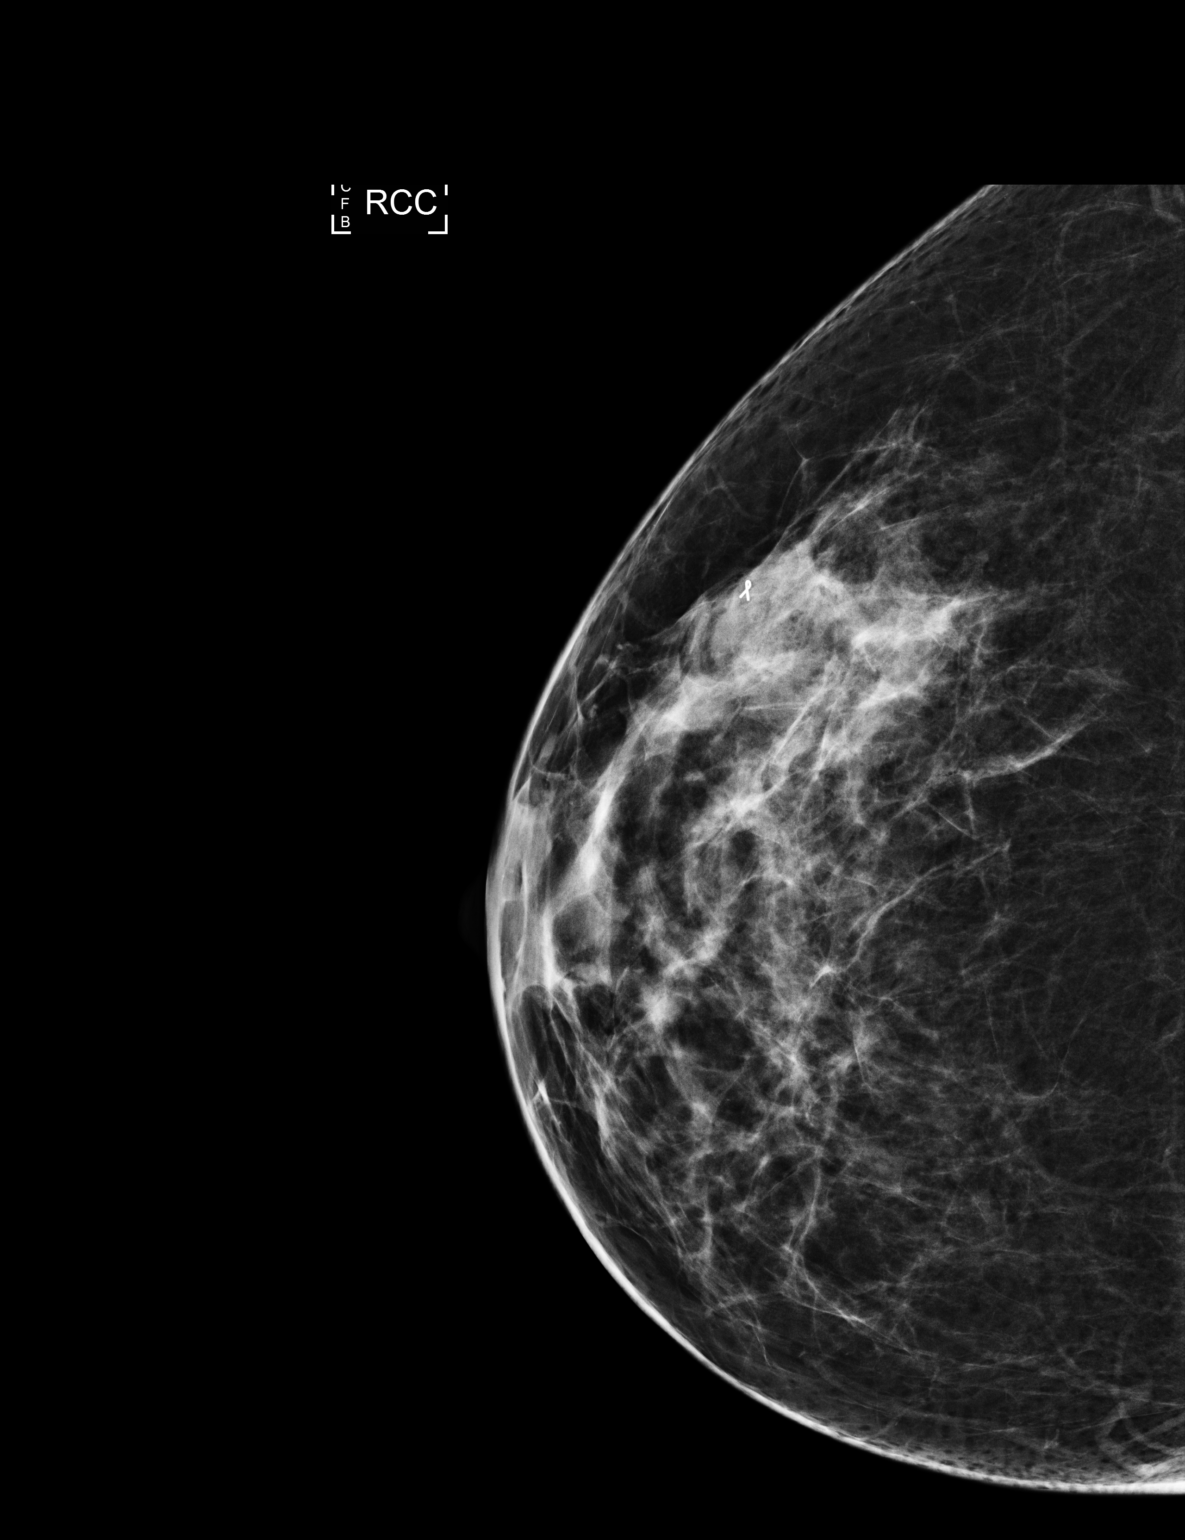

[2 of 2 positions shown; findings below may reference images not displayed]

FINDINGS: Mammographic images were obtained following ultrasound guided biopsy
of right breast 8 o'clock mass. Two-view mammography demonstrates
presence of ribbon shaped marker in expected mammographic position.
IMPRESSION: Successful placement of ribbon shaped marker post ultrasound-guided
core needle biopsy of the right breast.

Final Assessment: Post Procedure Mammograms for Marker Placement

## 2019-09-03 IMAGING — US US BREAST BX W LOC DEV 1ST LESION IMG BX SPEC US GUIDE*R*
1 series · 11 of 11 positions shown · non-contrast
Comparison: Previous exam(s).

ADDENDUM:
Pathology revealed FIBROADENOMA of the Right breast, 8 o'clock. This
was found to be concordant by Dr. Ariadne Juarez.

Pathology results were discussed with the patient by telephone. The
patient reported doing well after the biopsy with tenderness at the
site. Post biopsy instructions and care were reviewed and questions
were answered. The patient was encouraged to call The [REDACTED]
The patient was instructed to return for annual screening
mammography.
Pathology results reported by Kongor Lado, RN on 09/05/2018.
CLINICAL DATA: Right breast 8 o'clock mass.
EXAM:
ULTRASOUND GUIDED RIGHT BREAST CORE NEEDLE BIOPSY

[Series 1: us breast bx w loc dev 1st lesion img bx spec us g · 0.06mm/px · 11 of 11 slices shown]
[im 1/11]
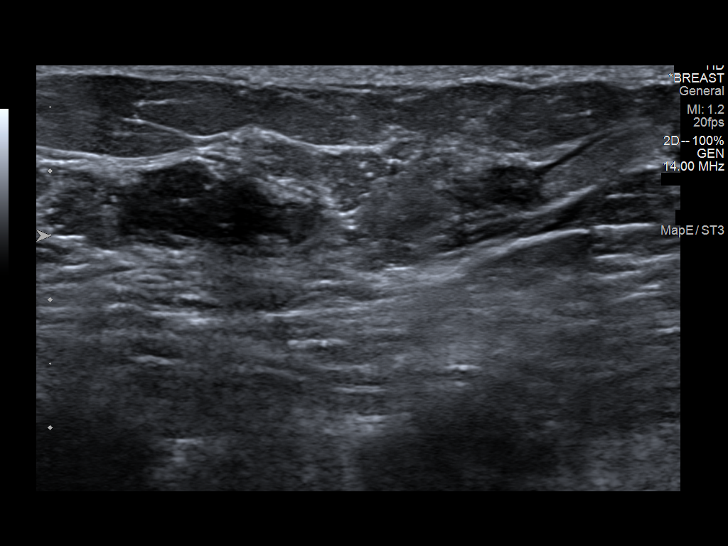
[im 2/11]
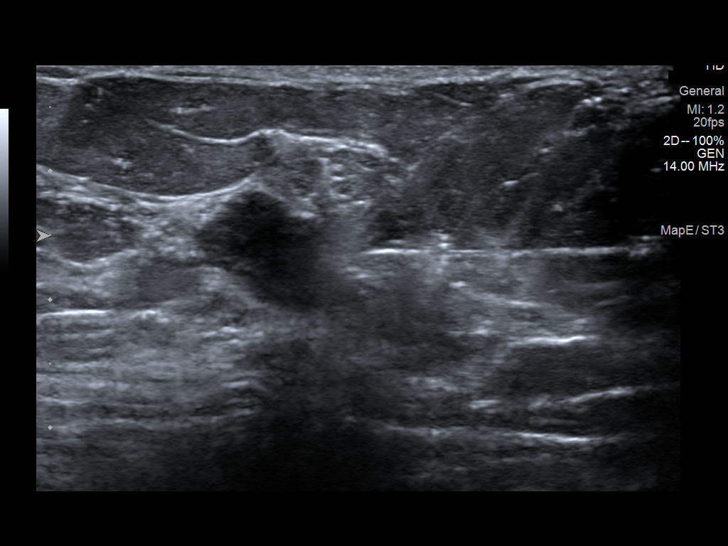
[im 3/11]
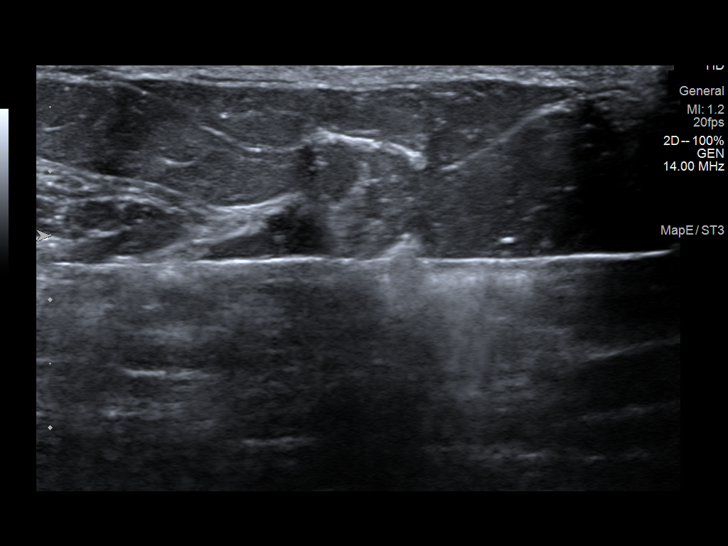
[im 4/11]
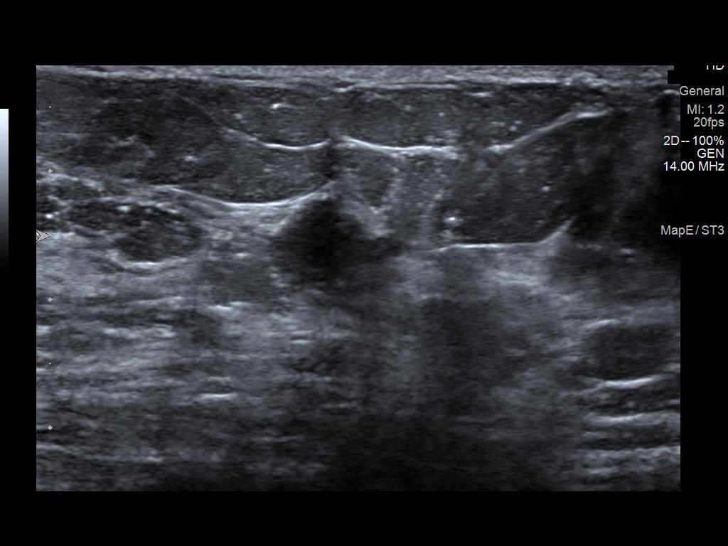
[im 5/11]
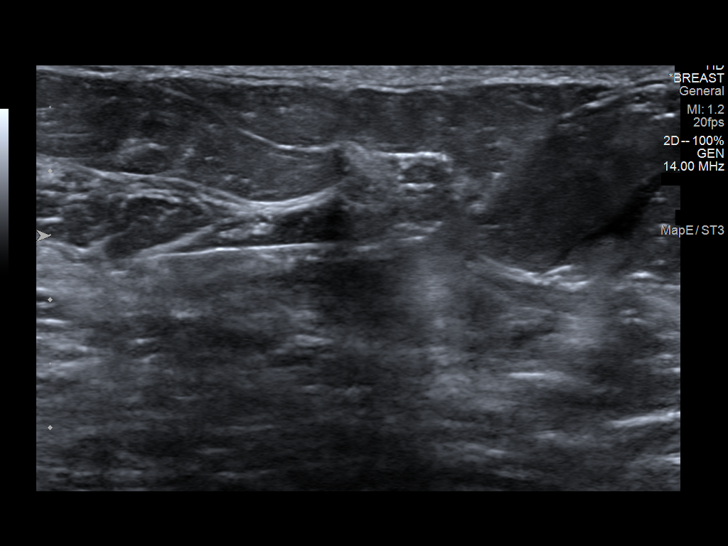
[im 6/11]
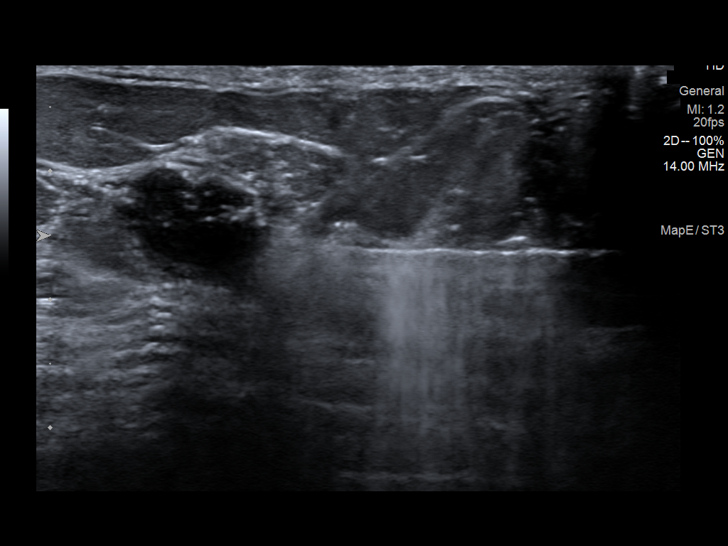
[im 7/11]
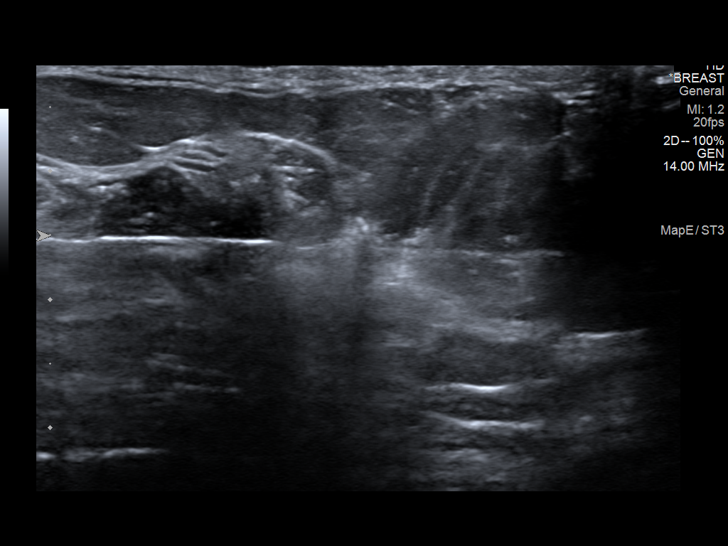
[im 8/11]
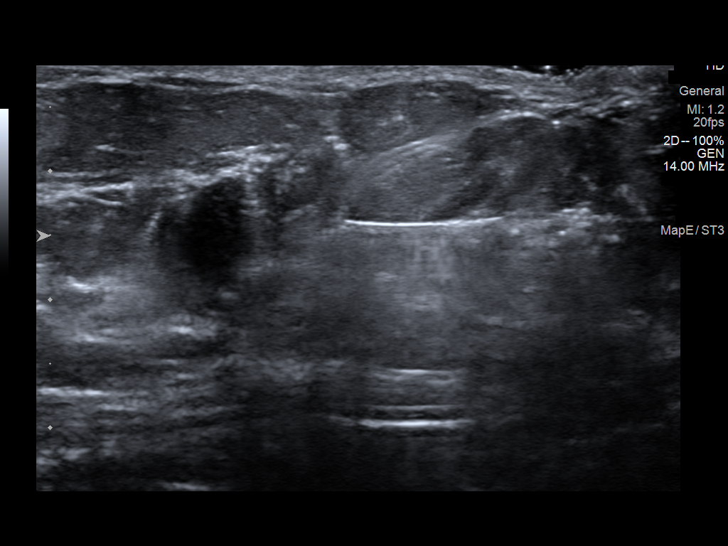
[im 9/11]
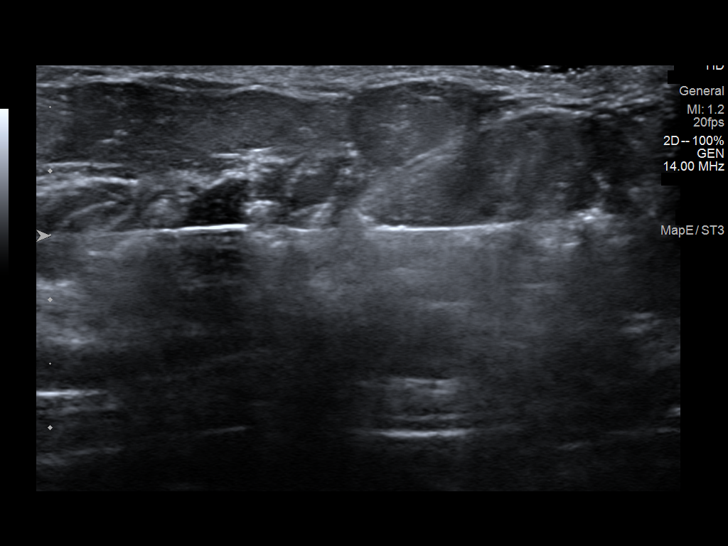
[im 10/11]
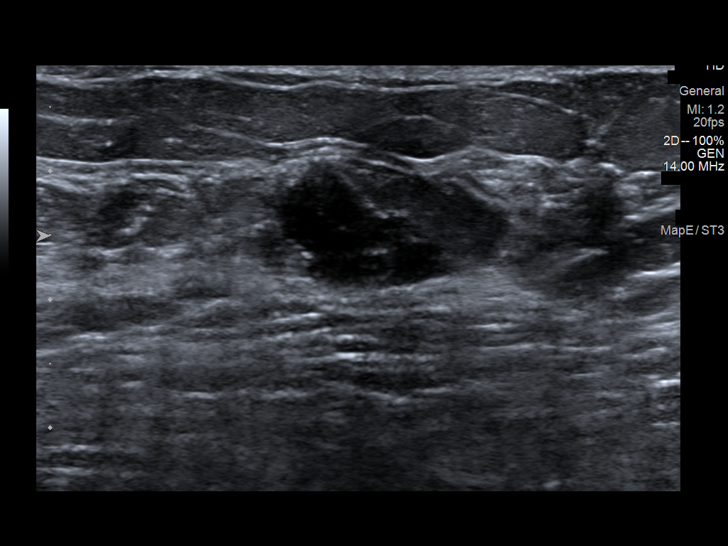
[im 11/11]
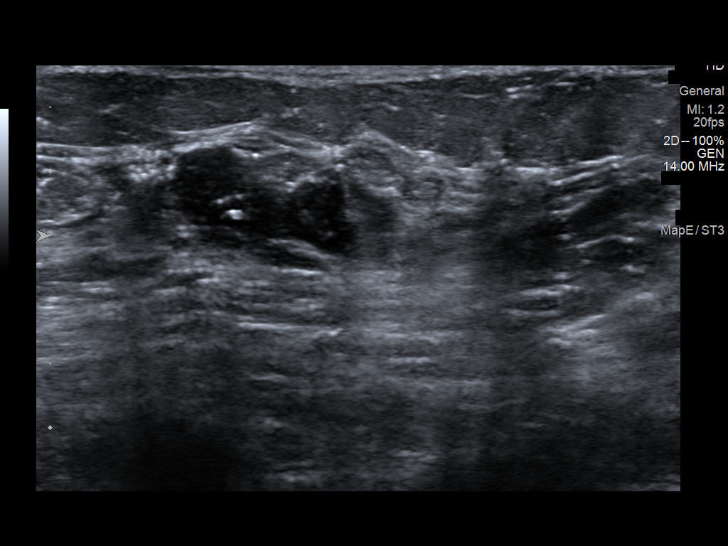

[11 of 11 positions shown; findings below may reference images not displayed]



Lesion quadrant: Lower outer quadrant

Using sterile technique and 1% Lidocaine as local anesthetic, under
direct ultrasound visualization, a 14 gauge Bugti Baloch device was
used to perform biopsy of right breast 8 o'clock mass using a
inferior approach. At the conclusion of the procedure a ribbon
shaped tissue marker clip was deployed into the biopsy cavity.
Follow up 2 view mammogram was performed and dictated separately.
IMPRESSION: Ultrasound guided biopsy of right breast. No apparent complications.

## 2019-11-21 IMAGING — US US PELVIS COMPLETE TRANSABD/TRANSVAG W DUPLEX
1 series · 15 of 25 positions shown · non-contrast
Comparison: None.

CLINICAL DATA: Intermittent pelvic pain since 11/09/2018

EXAM:
TRANSABDOMINAL AND TRANSVAGINAL ULTRASOUND OF PELVIS
DOPPLER ULTRASOUND OF OVARIES
TECHNIQUE: Both transabdominal and transvaginal ultrasound examinations of the
pelvis were performed. Transabdominal technique was performed for
global imaging of the pelvis including uterus, ovaries, adnexal
regions, and pelvic cul-de-sac.
It was necessary to proceed with endovaginal exam following the
transabdominal exam to visualize the uterus, endometrium and ovaries
to better advantage. Color and duplex Doppler ultrasound was
utilized to evaluate blood flow to the ovaries.

[Series 1: us pelvis complete transabd/transvag w duplex · 15 of 58 slices shown]
[im 1/58]
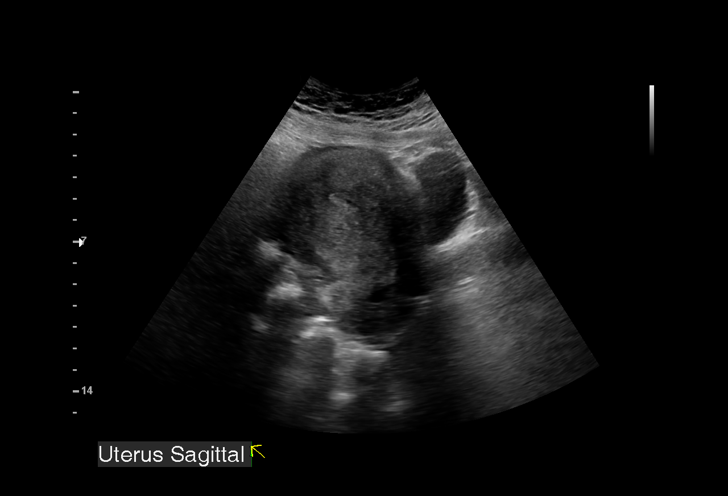
[im 5/58]
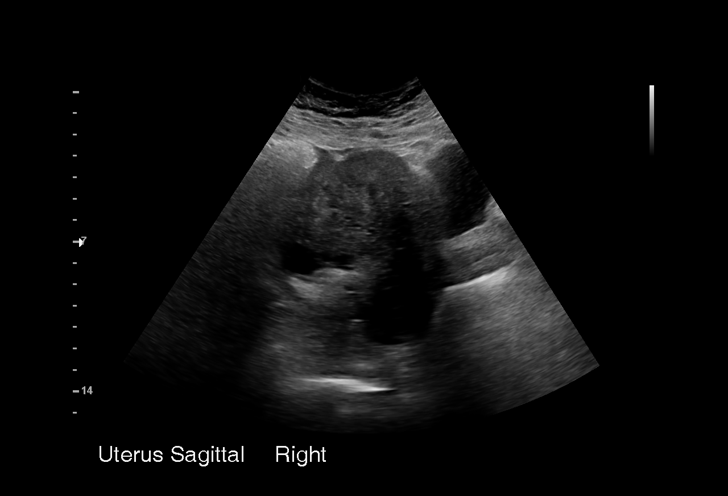
[im 10/58]
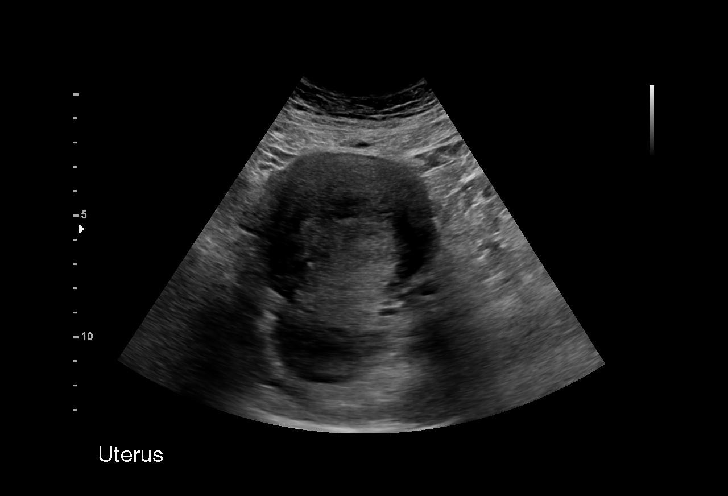
[im 12/58]
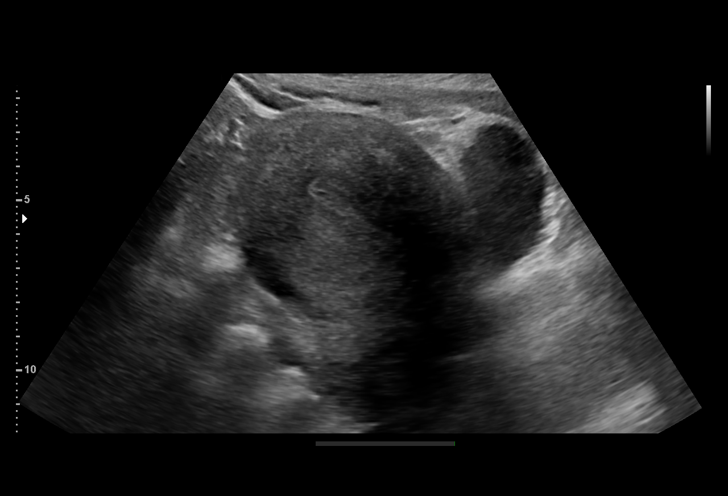
[im 17/58]
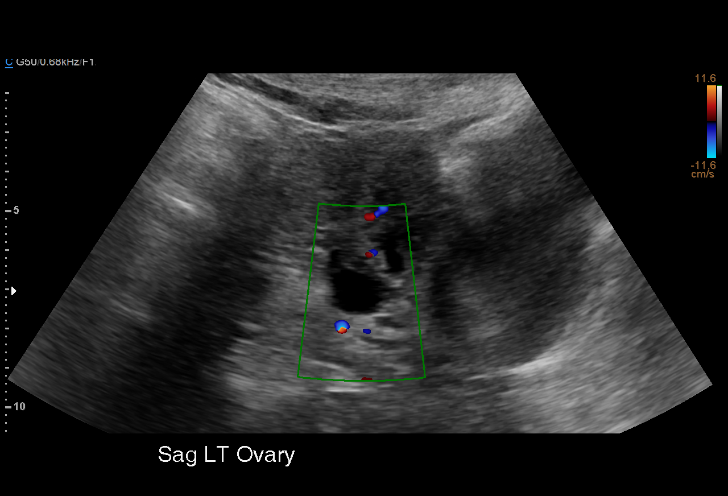
[im 22/58]
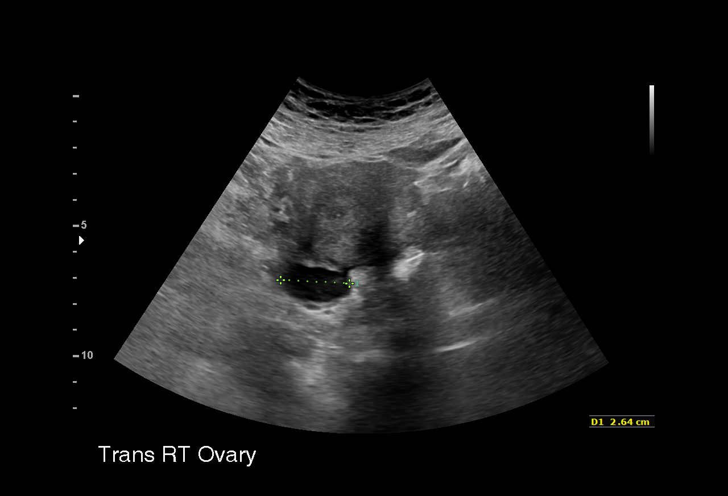
[im 24/58]
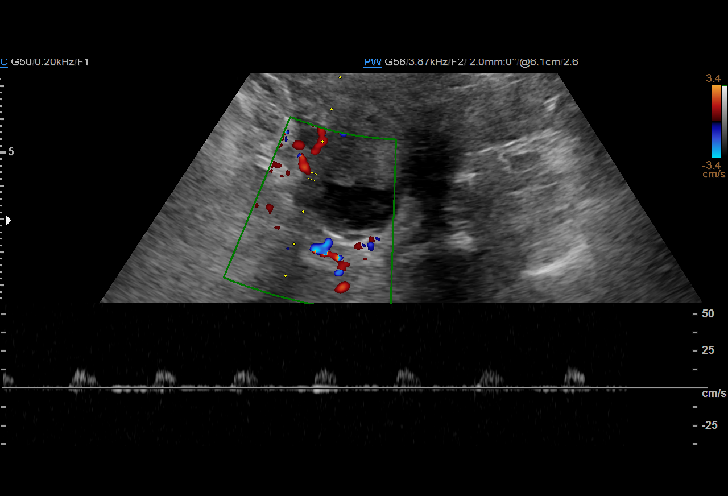
[im 29/58]
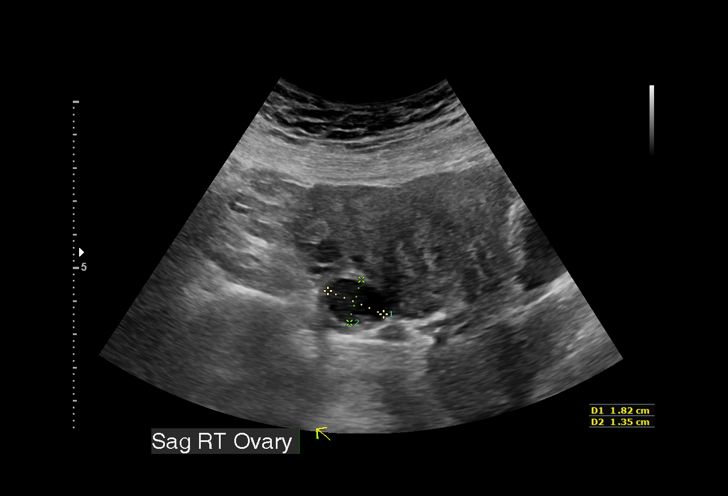
[im 34/58]
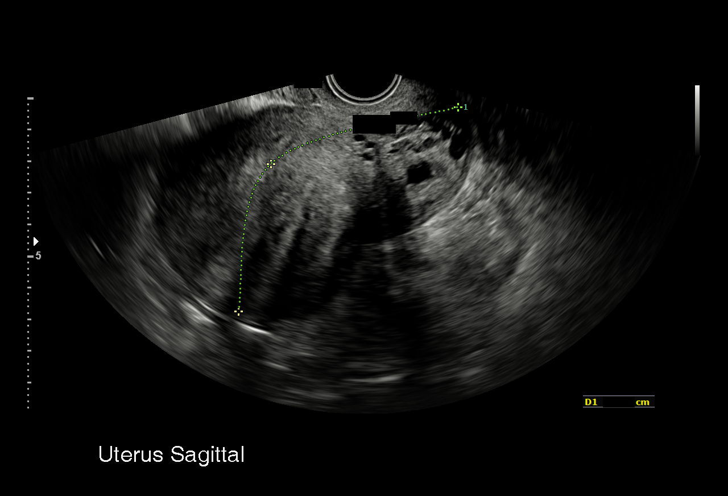
[im 36/58]
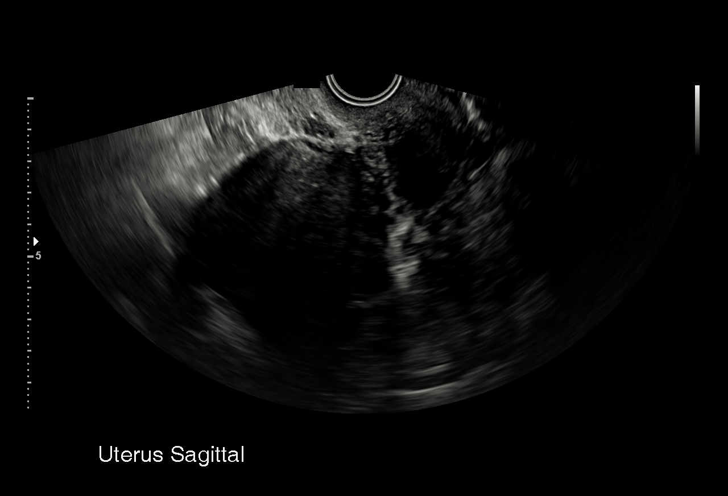
[im 41/58]
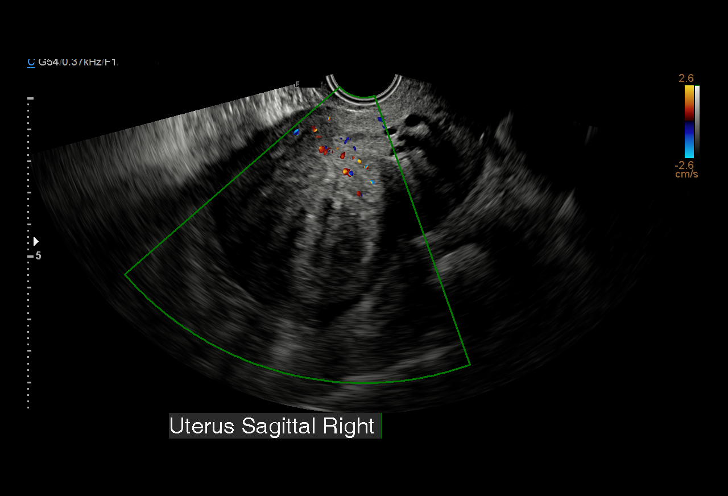
[im 46/58]
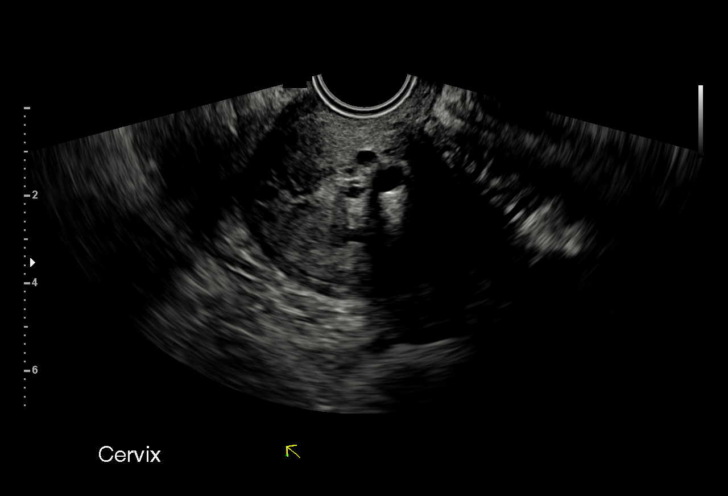
[im 48/58]
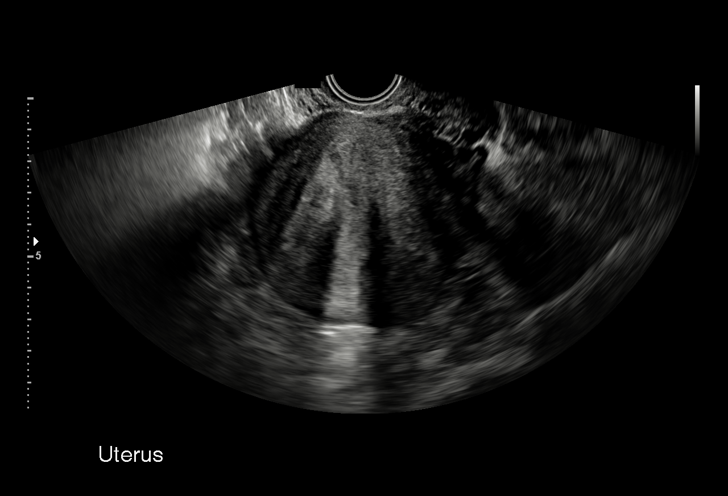
[im 53/58]
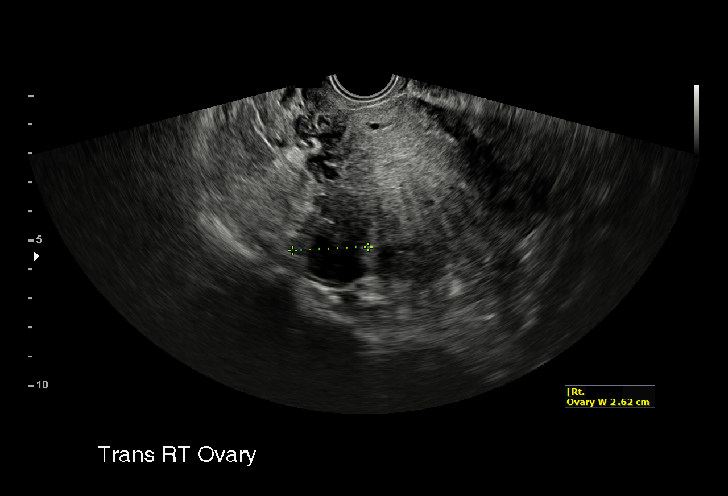
[im 58/58]
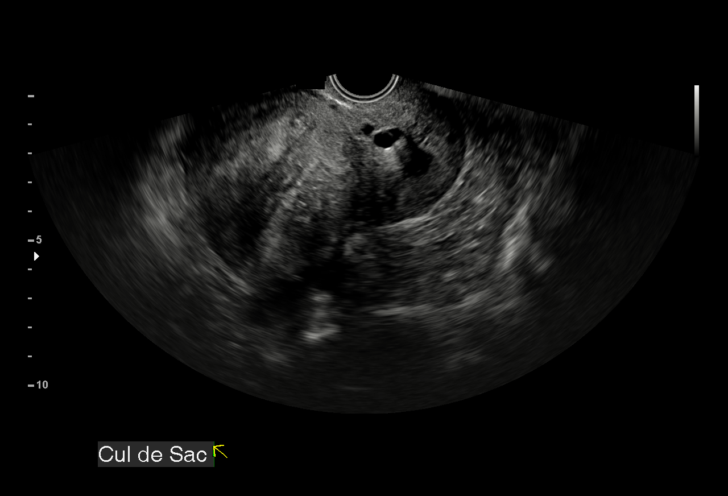

[15 of 25 positions shown; findings below may reference images not displayed]

FINDINGS: Uterus

Measurements: 10.2 x 6.3 x 6.4 cm = volume: 216 mL. Apparent nearly
isoechoic fibroid along the right posterior upper uterine segment
measuring 4.6 x 2.7 x 3.4 cm. No other uterine masses. Multiple
cervical nabothian cysts.

Endometrium

Thickness: 9 mm.  No focal abnormality visualized.

Right ovary

Measurements: 3.8 x 1.9 x 2.8 cm = volume: 10.3 mL. Normal
appearance/no adnexal mass.

Left ovary

Measurements: 2.3 x 1.4 x 1.6 cm = volume: 2.8 mL. Normal
appearance/no adnexal mass.

Pulsed Doppler evaluation of both ovaries demonstrates normal
low-resistance arterial and venous waveforms.

Other findings

No abnormal free fluid.
IMPRESSION: 1. No acute findings.
2. Probable uterine fibroid measuring 4.6 cm. No other uterine
abnormality.
3. Ovaries and adnexa are within normal limits.  No ovarian torsion.

## 2019-11-27 ENCOUNTER — Other Ambulatory Visit: Payer: Self-pay | Admitting: Obstetrics and Gynecology

## 2019-11-27 DIAGNOSIS — Z1231 Encounter for screening mammogram for malignant neoplasm of breast: Secondary | ICD-10-CM

## 2020-01-07 ENCOUNTER — Ambulatory Visit: Payer: 59

## 2020-01-21 ENCOUNTER — Ambulatory Visit
Admission: RE | Admit: 2020-01-21 | Discharge: 2020-01-21 | Disposition: A | Discharge status: Home / Self Care | Payer: PRIVATE HEALTH INSURANCE | Source: Ambulatory Visit | Attending: Obstetrics and Gynecology | Admitting: Obstetrics and Gynecology

## 2020-01-21 ENCOUNTER — Other Ambulatory Visit: Payer: Self-pay

## 2020-01-21 DIAGNOSIS — Z1231 Encounter for screening mammogram for malignant neoplasm of breast: Secondary | ICD-10-CM

## 2021-01-03 ENCOUNTER — Other Ambulatory Visit: Payer: Self-pay | Admitting: Obstetrics and Gynecology

## 2021-01-03 DIAGNOSIS — Z0000 Encounter for general adult medical examination without abnormal findings: Secondary | ICD-10-CM

## 2021-02-28 ENCOUNTER — Ambulatory Visit
Admission: RE | Admit: 2021-02-28 | Discharge: 2021-02-28 | Disposition: A | Discharge status: Home / Self Care | Payer: PRIVATE HEALTH INSURANCE | Source: Ambulatory Visit | Attending: Obstetrics and Gynecology | Admitting: Obstetrics and Gynecology

## 2021-02-28 ENCOUNTER — Other Ambulatory Visit: Payer: Self-pay

## 2021-02-28 DIAGNOSIS — Z0000 Encounter for general adult medical examination without abnormal findings: Secondary | ICD-10-CM

## 2022-01-31 ENCOUNTER — Other Ambulatory Visit: Payer: Self-pay | Admitting: Obstetrics and Gynecology

## 2022-01-31 DIAGNOSIS — Z1231 Encounter for screening mammogram for malignant neoplasm of breast: Secondary | ICD-10-CM

## 2022-03-02 ENCOUNTER — Ambulatory Visit
Admission: RE | Admit: 2022-03-02 | Discharge: 2022-03-02 | Disposition: A | Discharge status: Home / Self Care | Payer: Managed Care, Other (non HMO) | Source: Ambulatory Visit | Attending: Obstetrics and Gynecology | Admitting: Obstetrics and Gynecology

## 2022-03-02 DIAGNOSIS — Z1231 Encounter for screening mammogram for malignant neoplasm of breast: Secondary | ICD-10-CM

## 2023-02-13 ENCOUNTER — Other Ambulatory Visit: Payer: Self-pay | Admitting: Obstetrics and Gynecology

## 2023-02-13 DIAGNOSIS — Z0000 Encounter for general adult medical examination without abnormal findings: Secondary | ICD-10-CM

## 2023-04-09 ENCOUNTER — Ambulatory Visit: Payer: PRIVATE HEALTH INSURANCE

## 2023-04-26 ENCOUNTER — Ambulatory Visit
Admission: RE | Admit: 2023-04-26 | Discharge: 2023-04-26 | Disposition: A | Discharge status: Home / Self Care | Payer: BC Managed Care – PPO | Source: Ambulatory Visit | Attending: Obstetrics and Gynecology | Admitting: Obstetrics and Gynecology

## 2023-04-26 DIAGNOSIS — Z0000 Encounter for general adult medical examination without abnormal findings: Secondary | ICD-10-CM
